# Patient Record
Sex: Female | Born: 1950 | Hispanic: No | Marital: Married | Smoking: Never smoker
Health system: Southern US, Community
[De-identification: ages and names within clinical notes are randomized; demographics above are authoritative.]

## PROBLEM LIST (undated history)

## (undated) DIAGNOSIS — E785 Hyperlipidemia, unspecified: Secondary | ICD-10-CM

## (undated) DIAGNOSIS — I10 Essential (primary) hypertension: Secondary | ICD-10-CM

## (undated) DIAGNOSIS — G7000 Myasthenia gravis without (acute) exacerbation: Secondary | ICD-10-CM

## (undated) DIAGNOSIS — E119 Type 2 diabetes mellitus without complications: Secondary | ICD-10-CM

## (undated) DIAGNOSIS — K219 Gastro-esophageal reflux disease without esophagitis: Secondary | ICD-10-CM

## (undated) HISTORY — DX: Type 2 diabetes mellitus without complications: E11.9

## (undated) HISTORY — DX: Gastro-esophageal reflux disease without esophagitis: K21.9

## (undated) HISTORY — DX: Myasthenia gravis without (acute) exacerbation: G70.00

## (undated) HISTORY — DX: Essential (primary) hypertension: I10

## (undated) HISTORY — DX: Hyperlipidemia, unspecified: E78.5

## (undated) HISTORY — PX: ABDOMINAL HYSTERECTOMY: SHX81

---

## 1998-07-14 ENCOUNTER — Other Ambulatory Visit: Admission: RE | Admit: 1998-07-14 | Discharge: 1998-07-14 | Payer: Self-pay | Admitting: Obstetrics and Gynecology

## 2000-09-29 ENCOUNTER — Other Ambulatory Visit: Admission: RE | Admit: 2000-09-29 | Discharge: 2000-09-29 | Payer: Self-pay | Admitting: Obstetrics and Gynecology

## 2001-06-11 ENCOUNTER — Encounter: Admission: RE | Admit: 2001-06-11 | Discharge: 2001-06-11 | Payer: Self-pay | Admitting: Emergency Medicine

## 2001-06-11 ENCOUNTER — Encounter: Payer: Self-pay | Admitting: Emergency Medicine

## 2003-11-28 ENCOUNTER — Encounter: Admission: RE | Admit: 2003-11-28 | Discharge: 2003-11-28 | Payer: Self-pay | Admitting: Emergency Medicine

## 2004-03-23 ENCOUNTER — Encounter: Admission: RE | Admit: 2004-03-23 | Discharge: 2004-03-23 | Payer: Self-pay | Admitting: Emergency Medicine

## 2005-04-13 ENCOUNTER — Encounter: Admission: RE | Admit: 2005-04-13 | Discharge: 2005-06-08 | Payer: Self-pay | Admitting: Emergency Medicine

## 2006-03-29 ENCOUNTER — Encounter: Admission: RE | Admit: 2006-03-29 | Discharge: 2006-03-29 | Payer: Self-pay | Admitting: Emergency Medicine

## 2006-04-19 ENCOUNTER — Ambulatory Visit (HOSPITAL_COMMUNITY): Admission: RE | Admit: 2006-04-19 | Discharge: 2006-04-19 | Payer: Self-pay | Admitting: Gastroenterology

## 2008-01-28 ENCOUNTER — Encounter: Admission: RE | Admit: 2008-01-28 | Discharge: 2008-01-28 | Payer: Self-pay | Admitting: Internal Medicine

## 2009-07-31 ENCOUNTER — Encounter: Admission: RE | Admit: 2009-07-31 | Discharge: 2009-07-31 | Payer: Self-pay | Admitting: Internal Medicine

## 2010-03-13 ENCOUNTER — Encounter: Payer: Self-pay | Admitting: Emergency Medicine

## 2010-07-09 NOTE — Op Note (Signed)
NAMEBRIGHTEN, BUZZELLI               ACCOUNT NO.:  [id number]   MEDICAL RECORD NO.:  [id number]          PATIENT TYPE:  AMB   LOCATION:  ENDO                         FACILITY:  [facility]   PHYSICIAN:  Anselmo Rod, M.D.  DATE OF BIRTH:  [birth year]   DATE OF PROCEDURE:  04/19/2006  DATE OF DISCHARGE:                               OPERATIVE REPORT   PROCEDURE PERFORMED:  Screening colonoscopy.   ENDOSCOPIST:  Anselmo Rod, MD.   INSTRUMENT USED:  Pentax video colonoscope.   INDICATIONS FOR PROCEDURE:  A 61 year old Asian female undergoing  screening colonoscopy to rule out colonic polyps, masses, etc.   PREPROCEDURE PREPARATION:  Informed consent was procured from the  patient.  The patient fasted for 4 hours prior to the procedure and  prepped with 20 OsmoPrep pills the night of and 12 pills the morning of  the procedure.  Risks and benefits of the procedure including a 10% miss  rate of cancer and polyp were discussed with the patient as well.   PREPARATION:  VITAL SIGNS:  The patient had stable vital signs.  NECK:  Supple.  CHEST:  Clear to auscultation.  S1, S2 regular.  ABDOMEN:  Soft with normal bowel sounds.   DESCRIPTION OF PROCEDURE:  The patient was placed in the left lateral  decubitus position and sedated with 100 mcg of fentanyl and 10 mg of  Versed given intravenously in slow incremental doses.  Once the patient  was adequately sedate and maintained on low-flow oxygen and continuous  cardiac monitoring, the Pentax video colonoscope was advanced from the  rectum to the cecum.  The appendiceal orifice and ileocecal valve were  visualized and photographed.  There was some residual stool in the  colon.  Multiple washes were done.  No masses, polyps, erosions,  ulcerations or diverticula were seen.  Retroflexion revealed no  biopsies.  The terminal ileum appeared healthy and without lesions.   IMPRESSION:  Normal colonoscopy up to the terminal ileum.  No masses,  polyps or diverticula seen.   RECOMMENDATIONS:  1. Continue a high fiber diet with liberal fluid intake.  2. Repeat colonoscopy in the next 10 years.  If the patient has any      abnormal symptoms in the interim, she has been advised to contact      the office immediately for further recommendations.  3. Outpatient follow-up as the need arises in the future.      Anselmo Rod, M.D.  Electronically Signed     JNM/MEDQ  D:  04/19/2006  T:  04/19/2006  Job:  [id number]   cc:   Reuben Likes, M.D.

## 2013-10-13 ENCOUNTER — Emergency Department (HOSPITAL_COMMUNITY): Payer: 59

## 2013-10-13 ENCOUNTER — Encounter (HOSPITAL_COMMUNITY): Payer: Self-pay | Admitting: Emergency Medicine

## 2013-10-13 ENCOUNTER — Inpatient Hospital Stay (HOSPITAL_COMMUNITY)
Admission: EM | Admit: 2013-10-13 | Discharge: 2013-10-14 | DRG: 069 | Disposition: A | Discharge status: Home / Self Care | Payer: 59 | Attending: Internal Medicine | Admitting: Internal Medicine

## 2013-10-13 ENCOUNTER — Observation Stay (HOSPITAL_COMMUNITY): Payer: 59

## 2013-10-13 ENCOUNTER — Ambulatory Visit (INDEPENDENT_AMBULATORY_CARE_PROVIDER_SITE_OTHER): Payer: 59 | Admitting: Emergency Medicine

## 2013-10-13 VITALS — BP 162/82 | HR 69 | Temp 98.5°F | Resp 17 | Ht 61.5 in | Wt 144.0 lb

## 2013-10-13 DIAGNOSIS — R42 Dizziness and giddiness: Secondary | ICD-10-CM

## 2013-10-13 DIAGNOSIS — G459 Transient cerebral ischemic attack, unspecified: Principal | ICD-10-CM | POA: Diagnosis present

## 2013-10-13 DIAGNOSIS — E785 Hyperlipidemia, unspecified: Secondary | ICD-10-CM | POA: Diagnosis present

## 2013-10-13 DIAGNOSIS — R4789 Other speech disturbances: Secondary | ICD-10-CM

## 2013-10-13 DIAGNOSIS — R29810 Facial weakness: Secondary | ICD-10-CM | POA: Diagnosis present

## 2013-10-13 DIAGNOSIS — E119 Type 2 diabetes mellitus without complications: Secondary | ICD-10-CM | POA: Diagnosis present

## 2013-10-13 DIAGNOSIS — Z79899 Other long term (current) drug therapy: Secondary | ICD-10-CM

## 2013-10-13 DIAGNOSIS — R4702 Dysphasia: Secondary | ICD-10-CM | POA: Diagnosis present

## 2013-10-13 DIAGNOSIS — I10 Essential (primary) hypertension: Secondary | ICD-10-CM | POA: Diagnosis present

## 2013-10-13 DIAGNOSIS — G458 Other transient cerebral ischemic attacks and related syndromes: Secondary | ICD-10-CM

## 2013-10-13 DIAGNOSIS — R4781 Slurred speech: Secondary | ICD-10-CM

## 2013-10-13 LAB — SEDIMENTATION RATE: Sed Rate: 33 mm/hr — ABNORMAL HIGH (ref 0–22)

## 2013-10-13 LAB — URINALYSIS, ROUTINE W REFLEX MICROSCOPIC
Bilirubin Urine: NEGATIVE
Glucose, UA: NEGATIVE mg/dL
HGB URINE DIPSTICK: NEGATIVE
KETONES UR: NEGATIVE mg/dL
Leukocytes, UA: NEGATIVE
Nitrite: NEGATIVE
Protein, ur: NEGATIVE mg/dL
Specific Gravity, Urine: 1.008 (ref 1.005–1.030)
Urobilinogen, UA: 0.2 mg/dL (ref 0.0–1.0)
pH: 7 (ref 5.0–8.0)

## 2013-10-13 LAB — CBC
HEMATOCRIT: 38 % (ref 36.0–46.0)
HEMOGLOBIN: 13.4 g/dL (ref 12.0–15.0)
MCH: 30.1 pg (ref 26.0–34.0)
MCHC: 35.3 g/dL (ref 30.0–36.0)
MCV: 85.4 fL (ref 78.0–100.0)
Platelets: 275 10*3/uL (ref 150–400)
RBC: 4.45 MIL/uL (ref 3.87–5.11)
RDW: 12.3 % (ref 11.5–15.5)
WBC: 7.1 10*3/uL (ref 4.0–10.5)

## 2013-10-13 LAB — GLUCOSE, POCT (MANUAL RESULT ENTRY): POC GLUCOSE: 167 mg/dL — AB (ref 70–99)

## 2013-10-13 LAB — DIFFERENTIAL
BASOS PCT: 0 % (ref 0–1)
Basophils Absolute: 0 10*3/uL (ref 0.0–0.1)
EOS ABS: 0.3 10*3/uL (ref 0.0–0.7)
Eosinophils Relative: 4 % (ref 0–5)
LYMPHS ABS: 2.5 10*3/uL (ref 0.7–4.0)
Lymphocytes Relative: 36 % (ref 12–46)
Monocytes Absolute: 0.4 10*3/uL (ref 0.1–1.0)
Monocytes Relative: 6 % (ref 3–12)
NEUTROS ABS: 3.8 10*3/uL (ref 1.7–7.7)
Neutrophils Relative %: 54 % (ref 43–77)

## 2013-10-13 LAB — I-STAT TROPONIN, ED: TROPONIN I, POC: 0 ng/mL (ref 0.00–0.08)

## 2013-10-13 LAB — RAPID URINE DRUG SCREEN, HOSP PERFORMED
Amphetamines: NOT DETECTED
Barbiturates: NOT DETECTED
Benzodiazepines: NOT DETECTED
Cocaine: NOT DETECTED
OPIATES: NOT DETECTED
Tetrahydrocannabinol: NOT DETECTED

## 2013-10-13 LAB — I-STAT CHEM 8, ED
BUN: 13 mg/dL (ref 6–23)
Calcium, Ion: 1.18 mmol/L (ref 1.13–1.30)
Chloride: 105 mEq/L (ref 96–112)
Creatinine, Ser: 0.8 mg/dL (ref 0.50–1.10)
GLUCOSE: 168 mg/dL — AB (ref 70–99)
HCT: 40 % (ref 36.0–46.0)
HEMOGLOBIN: 13.6 g/dL (ref 12.0–15.0)
POTASSIUM: 4.5 meq/L (ref 3.7–5.3)
Sodium: 135 mEq/L — ABNORMAL LOW (ref 137–147)
TCO2: 29 mmol/L (ref 0–100)

## 2013-10-13 LAB — COMPREHENSIVE METABOLIC PANEL
ALT: 26 U/L (ref 0–35)
ANION GAP: 11 (ref 5–15)
AST: 22 U/L (ref 0–37)
Albumin: 3.9 g/dL (ref 3.5–5.2)
Alkaline Phosphatase: 91 U/L (ref 39–117)
BILIRUBIN TOTAL: 0.3 mg/dL (ref 0.3–1.2)
BUN: 13 mg/dL (ref 6–23)
CALCIUM: 9.7 mg/dL (ref 8.4–10.5)
CHLORIDE: 98 meq/L (ref 96–112)
CO2: 27 mEq/L (ref 19–32)
Creatinine, Ser: 0.74 mg/dL (ref 0.50–1.10)
GFR, EST NON AFRICAN AMERICAN: 89 mL/min — AB (ref >90)
GLUCOSE: 165 mg/dL — AB (ref 70–99)
POTASSIUM: 4.6 meq/L (ref 3.7–5.3)
Sodium: 136 mEq/L — ABNORMAL LOW (ref 137–147)
TOTAL PROTEIN: 8 g/dL (ref 6.0–8.3)

## 2013-10-13 LAB — CK: CK TOTAL: 102 U/L (ref 7–177)

## 2013-10-13 LAB — PROTIME-INR
INR: 0.99 (ref 0.00–1.49)
PROTHROMBIN TIME: 13.1 s (ref 11.6–15.2)

## 2013-10-13 LAB — ETHANOL: Alcohol, Ethyl (B): <11 mg/dL (ref 0–11)

## 2013-10-13 LAB — GLUCOSE, CAPILLARY: GLUCOSE-CAPILLARY: 263 mg/dL — AB (ref 70–99)

## 2013-10-13 LAB — APTT: APTT: 39 s — AB (ref 24–37)

## 2013-10-13 MED ORDER — AMLODIPINE BESYLATE 5 MG PO TABS
5.0000 mg | ORAL_TABLET | Freq: Every day | ORAL | Status: DC
  Administered 2013-10-13 – 2013-10-14 (×2): 5 mg via ORAL
  Filled 2013-10-13 (×2): qty 1

## 2013-10-13 MED ORDER — ASPIRIN 325 MG PO TABS
325.0000 mg | ORAL_TABLET | Freq: Every day | ORAL | Status: DC
  Administered 2013-10-13 – 2013-10-14 (×2): 325 mg via ORAL
  Filled 2013-10-13 (×2): qty 1

## 2013-10-13 MED ORDER — METOPROLOL SUCCINATE ER 50 MG PO TB24
50.0000 mg | ORAL_TABLET | Freq: Every day | ORAL | Status: DC
  Administered 2013-10-14: 50 mg via ORAL
  Filled 2013-10-13 (×2): qty 1

## 2013-10-13 MED ORDER — IRBESARTAN 150 MG PO TABS
150.0000 mg | ORAL_TABLET | Freq: Every day | ORAL | Status: DC
  Administered 2013-10-13 – 2013-10-14 (×2): 150 mg via ORAL
  Filled 2013-10-13 (×2): qty 1

## 2013-10-13 MED ORDER — ONDANSETRON HCL 4 MG PO TABS: 4.0000 mg | ORAL_TABLET | Freq: Four times a day (QID) | ORAL | Status: DC | PRN

## 2013-10-13 MED ORDER — ONDANSETRON HCL 4 MG/2ML IJ SOLN: 4.0000 mg | Freq: Four times a day (QID) | INTRAMUSCULAR | Status: DC | PRN

## 2013-10-13 MED ORDER — ATORVASTATIN CALCIUM 10 MG PO TABS
10.0000 mg | ORAL_TABLET | Freq: Every day | ORAL | Status: DC
  Administered 2013-10-13 – 2013-10-14 (×2): 10 mg via ORAL
  Filled 2013-10-13 (×2): qty 1

## 2013-10-13 MED ORDER — MECLIZINE HCL 25 MG PO TABS
25.0000 mg | ORAL_TABLET | Freq: Once | ORAL | Status: AC
  Administered 2013-10-13: 25 mg via ORAL
  Filled 2013-10-13: qty 1

## 2013-10-13 MED ORDER — HYDROCHLOROTHIAZIDE 12.5 MG PO CAPS
12.5000 mg | ORAL_CAPSULE | Freq: Every day | ORAL | Status: DC
  Administered 2013-10-13 – 2013-10-14 (×2): 12.5 mg via ORAL
  Filled 2013-10-13 (×2): qty 1

## 2013-10-13 MED ORDER — ACETAMINOPHEN 325 MG PO TABS
650.0000 mg | ORAL_TABLET | Freq: Four times a day (QID) | ORAL | Status: DC | PRN
Start: 2013-10-13 — End: 2013-10-14

## 2013-10-13 MED ORDER — INSULIN ASPART 100 UNIT/ML ~~LOC~~ SOLN
0.0000 [IU] | Freq: Three times a day (TID) | SUBCUTANEOUS | Status: DC
  Administered 2013-10-14: 5 [IU] via SUBCUTANEOUS
  Administered 2013-10-14 (×2): 2 [IU] via SUBCUTANEOUS

## 2013-10-13 MED ORDER — HEPARIN SODIUM (PORCINE) 5000 UNIT/ML IJ SOLN
5000.0000 [IU] | Freq: Three times a day (TID) | INTRAMUSCULAR | Status: DC
  Administered 2013-10-13 – 2013-10-14 (×3): 5000 [IU] via SUBCUTANEOUS
  Filled 2013-10-13 (×3): qty 1

## 2013-10-13 MED ORDER — PANTOPRAZOLE SODIUM 40 MG PO TBEC
40.0000 mg | DELAYED_RELEASE_TABLET | Freq: Every day | ORAL | Status: DC
  Administered 2013-10-13 – 2013-10-14 (×2): 40 mg via ORAL
  Filled 2013-10-13 (×2): qty 1

## 2013-10-13 MED ORDER — SODIUM CHLORIDE 0.9 % IV SOLN
INTRAVENOUS | Status: DC
  Administered 2013-10-13: 19:00:00 via INTRAVENOUS

## 2013-10-13 MED ORDER — INSULIN ASPART 100 UNIT/ML ~~LOC~~ SOLN
0.0000 [IU] | Freq: Every day | SUBCUTANEOUS | Status: DC
  Administered 2013-10-13: 3 [IU] via SUBCUTANEOUS

## 2013-10-13 MED ORDER — ACETAMINOPHEN 650 MG RE SUPP
650.0000 mg | Freq: Four times a day (QID) | RECTAL | Status: DC | PRN
Start: 2013-10-13 — End: 2013-10-14

## 2013-10-13 MED ORDER — OLMESARTAN-AMLODIPINE-HCTZ 20-5-12.5 MG PO TABS: 1.0000 | ORAL_TABLET | Freq: Every day | ORAL | Status: DC

## 2013-10-13 NOTE — Progress Notes (Addendum)
Subjective:    [name], female    DOB: [birth year], 63 y.o.   MRN: [id number] This chart was scribed for [place]. Everlene Farrier, MD by Steva Colder, ED Scribe. The patient was seen in room 6 at 1:24 PM.   Chief Complaint  Patient presents with  . Dizziness  . Aphasia    HPI Cassidy Carlson is a 63 y.o. female who presents today complaining of dizziness and aphasia onset yesterday afternoon around 2 PM. Daughter states that the pt when she tries to talk her tongue feels heavy and it is hard for the words to come out. She denies her tongue being swollen at the time. She states that when she opens her eyes she feels dizzy, but when her eyes are closed, she feels fine. Daughter states that the pt was getting better yesterday a little while after the symptoms began. Daughter states that today the pt is getting worse. She states that she is having associated symptoms of weakness and double vision. She states that with far vision or looking at something far away, she sees double. She denies HA, CP, and any other associated symptoms. Daughter states that the pt glucose this morning was 120.    There are no active problems to display for this patient.  No past medical history on file. No past surgical history on file. Allergies  Allergen Reactions  . Sulfa Antibiotics Rash   Prior to Admission medications   Medication Sig Start Date End Date Taking? Authorizing Provider  metoprolol succinate (TOPROL-XL) 50 MG 24 hr tablet Take 50 mg by mouth daily. Take with or immediately following a meal.   Yes Historical Provider, MD  Olmesartan-Amlodipine-HCTZ (TRIBENZOR) 20-5-12.5 MG TABS Take by mouth.   Yes Historical Provider, MD  rosuvastatin (CRESTOR) 10 MG tablet Take 10 mg by mouth daily.   Yes Historical Provider, MD  sitaGLIPtin (JANUVIA) 100 MG tablet Take 100 mg by mouth daily.   Yes Historical Provider, MD  escitalopram (LEXAPRO) 5 MG tablet Take 5 mg by mouth daily.    Historical  Provider, MD      Review of Systems  Eyes:       Seeing double  Cardiovascular: Negative for chest pain.  Neurological: Positive for dizziness and weakness. Negative for headaches.       Objective:   Physical Exam  Nursing note and vitals reviewed. Constitutional: She is oriented to person, place, and time. She appears well-developed and well-nourished. No distress.  HENT:  Head: Normocephalic and atraumatic.  Eyes: EOM are normal.  Neck: Neck supple. No tracheal deviation present.  Cardiovascular: Normal rate.   Pulmonary/Chest: Effort normal. No respiratory distress.  Musculoskeletal: Normal range of motion.  Neurological: She is alert and oriented to person, place, and time.  Reflex Scores:      Patellar reflexes are 3+ on the right side and 3+ on the left side. Thickening of speech. Difficulty pronouncing words. NAD. No carotid bruits. Motor strength 5/5 DTR 3 + in knees. Toes are down going with a Babinski response. Rhomberg's is not ataxic, but there may be some upper extremity dysmetria.   Skin: Skin is warm and dry.  Psychiatric: She has a normal mood and affect. Her behavior is normal.    No results found for this or any previous visit.  Glucose 167    BP 162/82  Pulse 69  Temp(Src) 98.5 F (36.9 C) (Oral)  Resp 17  Ht 5' 1.5" (1.562 m)  Wt 144 lb (  65.318 kg)  BMI 26.77 kg/m2  SpO2 98%  Assessment & Plan:  DIAGNOSTIC STUDIES: Oxygen Saturation is 98% on room air, normal by my interpretation.    COORDINATION OF CARE: 1:37 PM-Discussed treatment plan which includes transport to [facility] for further evaulation with pt family at bedside and pt family agreed to plan.  Patient with a history of diabetes. Patient has a history of difficulty with speech , she feels is low her tongue is thick and thin she has difficulty getting out specific words. She does not have much of a headache. She does state she has some vertigo and has had some double vision with this.  This certainly could be a problem in the posterior circulation. Her blood pressure is elevated.  I personally performed the services described in this documentation, which was scribed in my presence. The recorded information has been reviewed and is accurate.

## 2013-10-13 NOTE — Progress Notes (Signed)
Pt received to unit from ED via stretcher accompanied by husband and daughter. Pt speaks English as second language but communicates needs well. Pt states she is Muslim and prefers female Engineer, civil (consulting). Pt and family oriented to unit and safety needs.

## 2013-10-13 NOTE — ED Provider Notes (Signed)
CSN: [id number]     Arrival date & time 10/13/13  1420 History   First MD Initiated Contact with Patient 10/13/13 1506     Chief Complaint  Patient presents with  . Cerebrovascular Accident     (Consider location/radiation/quality/duration/timing/severity/associated sxs/prior Treatment) HPI Geralda Uphoff is a 63 y.o. female who presents to emergency department complaining of blurred vision and difficulty speaking. Patient states her symptoms began around 3 PM yesterday. She states symptoms began with double vision in both eyes, dizziness, sensation of room spinning, difficulty speaking. She states she feels like her to this heavy. She also feels like she can't puff out her cheeks specifically of the right side. She states she feels like her symptoms somewhat improved possibly, but returned this morning. States that gradually improving since but still not back to baseline. She states she still having a mild headache. She states her speech is improving. States vision is almost back to normal. She denies any difficulty understanding speech. No facial droop. No weakness or numbness in extremities. Patient does feel dizzy when she's walking.   History reviewed. No pertinent past medical history. History reviewed. No pertinent past surgical history. History reviewed. No pertinent family history. History  Substance Use Topics  . Smoking status: Never Smoker   . Smokeless tobacco: Not on file  . Alcohol Use: No   OB History   Grav Para Term Preterm Abortions TAB SAB Ect Mult Living                 Review of Systems  Constitutional: Negative for fever and chills.  Eyes: Positive for visual disturbance.  Respiratory: Negative for cough, chest tightness and shortness of breath.   Cardiovascular: Negative for chest pain, palpitations and leg swelling.  Gastrointestinal: Negative for nausea, vomiting, abdominal pain and diarrhea.  Genitourinary: Negative for dysuria, flank pain and pelvic pain.   Musculoskeletal: Negative for arthralgias, myalgias, neck pain and neck stiffness.  Skin: Negative for rash.  Neurological: Positive for dizziness, speech difficulty, light-headedness and headaches. Negative for syncope, facial asymmetry, weakness and numbness.  All other systems reviewed and are negative.     Allergies  Sulfa antibiotics  Home Medications   Prior to Admission medications   Medication Sig Start Date End Date Taking? Authorizing Provider  CALCIUM-VITAMIN D PO Take 1 tablet by mouth daily.   Yes Historical Provider, MD  esomeprazole (NEXIUM) 40 MG capsule Take 40 mg by mouth daily at 12 noon.   Yes Historical Provider, MD  metoprolol succinate (TOPROL-XL) 50 MG 24 hr tablet Take 50 mg by mouth daily. Take with or immediately following a meal.   Yes Historical Provider, MD  Multiple Vitamin (MULTIVITAMIN WITH MINERALS) TABS tablet Take 1 tablet by mouth daily.   Yes Historical Provider, MD  Olmesartan-Amlodipine-HCTZ (TRIBENZOR) 20-5-12.5 MG TABS Take 1 tablet by mouth daily.    Yes Historical Provider, MD  ranitidine (ZANTAC) 75 MG tablet Take 150 mg by mouth daily as needed for heartburn.   Yes Historical Provider, MD  rosuvastatin (CRESTOR) 10 MG tablet Take 10 mg by mouth daily.   Yes Historical Provider, MD  sitaGLIPtin (JANUVIA) 100 MG tablet Take 100 mg by mouth daily.   Yes Historical Provider, MD  THIAMINE HCL PO Take 1 tablet by mouth daily.   Yes Historical Provider, MD   BP 152/69  Pulse 64  Temp(Src) 98.1 F (36.7 C) (Oral)  Resp 20  Ht 5\' 2"  (1.575 m)  Wt 143 lb (64.864 kg)  BMI  26.15 kg/m2  SpO2 97% Physical Exam  Nursing note and vitals reviewed. Constitutional: She is oriented to person, place, and time. She appears well-developed and well-nourished. No distress.  HENT:  Head: Normocephalic.  Eyes: Conjunctivae and EOM are normal. Pupils are equal, round, and reactive to light.  Neck: Normal range of motion. Neck supple.  Cardiovascular:  Normal rate, regular rhythm and normal heart sounds.   Pulmonary/Chest: Effort normal and breath sounds normal. No respiratory distress. She has no wheezes. She has no rales.  Abdominal: Soft. Bowel sounds are normal. She exhibits no distension. There is no tenderness. There is no rebound.  Musculoskeletal: She exhibits no edema.  Neurological: She is alert and oriented to person, place, and time. She has normal strength. No sensory deficit. Coordination normal. GCS eye subscore is 4. GCS verbal subscore is 5. GCS motor subscore is 6.  Pt unable to puff up cheecks on right side, otherwise normal cranial nerves. No pronator drift. 5/5 and equal upper and lower extremity strength bilaterally. Visual fields intact  Skin: Skin is warm and dry.  Psychiatric: She has a normal mood and affect. Her behavior is normal.    ED Course  Procedures (including critical care time) Labs Review Labs Reviewed  APTT - Abnormal; Notable for the following:    aPTT 39 (*)    All other components within normal limits  COMPREHENSIVE METABOLIC PANEL - Abnormal; Notable for the following:    Sodium 136 (*)    Glucose, Bld 165 (*)    GFR calc non Af Amer 89 (*)    All other components within normal limits  I-STAT CHEM 8, ED - Abnormal; Notable for the following:    Sodium 135 (*)    Glucose, Bld 168 (*)    All other components within normal limits  ETHANOL  PROTIME-INR  CBC  DIFFERENTIAL  URINE RAPID DRUG SCREEN (HOSP PERFORMED)  URINALYSIS, ROUTINE W REFLEX MICROSCOPIC  I-STAT TROPOININ, ED    Imaging Review Ct Head Wo Contrast  10/13/2013   CLINICAL DATA:  Slurred speech  EXAM: CT HEAD WITHOUT CONTRAST  TECHNIQUE: Contiguous axial images were obtained from the base of the skull through the vertex without intravenous contrast.  COMPARISON:  None.  FINDINGS: Ventricles are normal in size and configuration. There is no mass, hemorrhage, extra-axial fluid collection, or midline shift. There is rather minimal  small vessel disease in the centra semiovale bilaterally. Elsewhere gray-white compartments are normal. No acute infarct is apparent. Bony calvarium appears intact. The mastoid air cells are clear.  IMPRESSION: Rather minimal periventricular small vessel disease. No demonstrable intracranial mass, hemorrhage, or acute appearing infarct.   Electronically Signed   By: Lowella Grip M.D.   On: 10/13/2013 15:37     EKG Interpretation   Date/Time:  Sunday October 13 2013 14:40:41 EDT Ventricular Rate:  63 PR Interval:  174 QRS Duration: 90 QT Interval:  411 QTC Calculation: 421 R Axis:   54 Text Interpretation:  Sinus rhythm RSR' in V1 or V2, probably normal  variant Similar to prior Confirmed by [facility]  MD, BLAIR ([id number]) on  10/14/2013 12:16:48 AM      MDM   Final diagnoses:  Dysphasia  Dizziness   Pt with difficulty with speech, "heavy tongue," dizziness. Concerning for CVA. Onset of symptoms 24 hrs ago. Will get labs, CT head. Monitor.    4:31 PM CT head negative. Labs unremarkable. Pt's symptoms improving. Discussed with [facility], will admit for further work up. MR brain  ordered.   Renold Genta, PA-C 10/14/13 [phone]

## 2013-10-13 NOTE — H&P (Signed)
[facility] History and Physical  Cassidy Carlson QBV:[id number] DOB: [birth year] DOA: 10/13/2013  Referring physician: Emergency Department PCP: No PCP Per Patient  Specialists:   Chief Complaint: Dizziness  HPI: Cassidy Carlson is a 63 y.o. female  With a hx of DM and HTN who presented to Urgent care with complaints of lightheadedness with slurred speech and some vision changes that started around 1400 on 8/22. Sx gradually subsided, however, pt presented for further work up and was subsequently referred to the ED for eval. In the ED, pt was found to have an unremarkable head CT. Given risk factors and presenting symptoms, the hospitalist service was consulted for CVA/TIA work up. Neurology has also been consulted through the ED.  Review of Systems: Per above, the remainder of the 10pt ros reviewed and are neg  History reviewed. No pertinent past medical history. History reviewed. No pertinent past surgical history. Social History:  reports that she has never smoked. She does not have any smokeless tobacco history on file. She reports that she does not drink alcohol. Her drug history is not on file.  where does patient live--home, ALF, SNF? and with whom if at home?  Can patient participate in ADLs?  Allergies  Allergen Reactions  . Sulfa Antibiotics Rash    History reviewed. No pertinent family history. reviewed and is noncontributory to this particular case (be sure to complete)  Prior to Admission medications   Medication Sig Start Date End Date Taking? Authorizing Provider  CALCIUM-VITAMIN D PO Take 1 tablet by mouth daily.   Yes Historical Provider, MD  esomeprazole (NEXIUM) 40 MG capsule Take 40 mg by mouth daily at 12 noon.   Yes Historical Provider, MD  metoprolol succinate (TOPROL-XL) 50 MG 24 hr tablet Take 50 mg by mouth daily. Take with or immediately following a meal.   Yes Historical Provider, MD  Multiple Vitamin (MULTIVITAMIN WITH MINERALS) TABS tablet Take 1  tablet by mouth daily.   Yes Historical Provider, MD  Olmesartan-Amlodipine-HCTZ (TRIBENZOR) 20-5-12.5 MG TABS Take 1 tablet by mouth daily.    Yes Historical Provider, MD  ranitidine (ZANTAC) 75 MG tablet Take 150 mg by mouth daily as needed for heartburn.   Yes Historical Provider, MD  rosuvastatin (CRESTOR) 10 MG tablet Take 10 mg by mouth daily.   Yes Historical Provider, MD  sitaGLIPtin (JANUVIA) 100 MG tablet Take 100 mg by mouth daily.   Yes Historical Provider, MD  THIAMINE HCL PO Take 1 tablet by mouth daily.   Yes Historical Provider, MD   Physical Exam: Filed Vitals:   10/13/13 1422 10/13/13 1433 10/13/13 1444 10/13/13 1630  BP: 164/62 152/69  145/60  Pulse: 68 64  64  Temp: 98.2 F (36.8 C) 98.1 F (36.7 C) 98.1 F (36.7 C)   TempSrc: Oral Oral    Resp: 21 20    Height: 5\' 2"  (1.575 m)     Weight: 64.864 kg (143 lb)     SpO2: 100% 97%  97%     General:  Awake, in nad  Eyes: PERRL B  ENT: membranes moist, dentition fair  Neck: trachea midline, neck supple  Cardiovascular: regular, s1, s2  Respiratory: normal resp effort, no wheezing  Abdomen: soft, nondistended  Skin: normal skin turgor, no abnormal skin lesions seen  Musculoskeletal: perfused, no clubbing  Psychiatric: mood/affect normal// no auditory/visual hallucinations  Neurologic: cn2-12 grossly intact/strength/sensation intact  Labs on Admission:  Basic Metabolic Panel:  Recent Labs Lab 10/13/13 1455 10/13/13 1514  NA 136*  135*  K 4.6 4.5  CL 98 105  CO2 27  --   GLUCOSE 165* 168*  BUN 13 13  CREATININE 0.74 0.80  CALCIUM 9.7  --    Liver Function Tests:  Recent Labs Lab 10/13/13 1455  AST 22  ALT 26  ALKPHOS 91  BILITOT 0.3  PROT 8.0  ALBUMIN 3.9   No results found for this basename: LIPASE, AMYLASE,  in the last 168 hours No results found for this basename: AMMONIA,  in the last 168 hours CBC:  Recent Labs Lab 10/13/13 1455 10/13/13 1514  WBC 7.1  --   NEUTROABS  3.8  --   HGB 13.4 13.6  HCT 38.0 40.0  MCV 85.4  --   PLT 275  --    Cardiac Enzymes: No results found for this basename: CKTOTAL, CKMB, CKMBINDEX, TROPONINI,  in the last 168 hours  BNP (last 3 results) No results found for this basename: PROBNP,  in the last 8760 hours CBG: No results found for this basename: GLUCAP,  in the last 168 hours  Radiological Exams on Admission: Ct Head Wo Contrast  10/13/2013   CLINICAL DATA:  Slurred speech  EXAM: CT HEAD WITHOUT CONTRAST  TECHNIQUE: Contiguous axial images were obtained from the base of the skull through the vertex without intravenous contrast.  COMPARISON:  None.  FINDINGS: Ventricles are normal in size and configuration. There is no mass, hemorrhage, extra-axial fluid collection, or midline shift. There is rather minimal small vessel disease in the centra semiovale bilaterally. Elsewhere gray-white compartments are normal. No acute infarct is apparent. Bony calvarium appears intact. The mastoid air cells are clear.  IMPRESSION: Rather minimal periventricular small vessel disease. No demonstrable intracranial mass, hemorrhage, or acute appearing infarct.   Electronically Signed   By: Lowella Grip M.D.   On: 10/13/2013 15:37    EKG: Independently reviewed. NSR  Assessment/Plan Principal Problem:   TIA (transient ischemic attack) Active Problems:   Diabetes   Unspecified essential hypertension   Dysphasia  1. TIA/CVA 1. Neurology consulted through the ED 2. Will continue on asa 3. Lipid panel and A1c ordered 4. Will f/u MRI brain 5. Check 2d echo and carotid dopplers 6. Admit to med-tele 7. PT/OT/SLP 2. DM 1. Check a1c 2. Cont on SSI alone and hold oral hyperglycemic while inpatient 3. Cont on carb modified diet 3. HTN 1. BP currently controlled 2. Cont home meds 4. DVT prophylaxis 1. Heparin subQ  Code Status: Full (must indicate code status--if unknown or must be presumed, indicate so) Family Communication: Pt in  room, family at bedside (indicate person spoken with, if applicable, with phone number if by telephone) Disposition Plan: Pending PT eval  Time spent: 32min  [facility], [facility] Pager [phone]  If 7PM-7AM, please contact night-coverage [email or web address] Password [email or web address] 10/13/2013, 5:02 PM

## 2013-10-13 NOTE — ED Notes (Signed)
Patient transported to MRI 

## 2013-10-13 NOTE — Consult Note (Signed)
Referring Physician: CHIU, S    Chief Complaint: New-onset dysarthria.  HPI: Cassidy Carlson is an 63 y.o. female history of hypertension and hyperlipidemia presenting with new-onset dysarthria which started about 2 PM on 10/12/2013. Dysarthria has slightly worsened since onset. There is no previous history stroke nor TIA. She has not been on antiplatelet therapy. She indicates she has been experiencing double vision occasionally, as well as slight drooping of her eyelids. She's also had slight difficulty with swallowing but no choking. She's had no focal motor weakness and no sensory changes on chronic carpal tunnel syndrome involving her right hand. CT scan of her head showed no acute intracranial abnormality. MRI results are pending. NIH stroke score was 1 for dysarthria.  LSN: 2 PM on 10/12/2013 tPA Given: No: Mild deficits; beyond time window for treatment consideration MRankin: 1  History reviewed. No pertinent past medical history.  History reviewed. No pertinent family history.   Medications: I have reviewed the patient's current medications.  ROS: History obtained from the patient  General ROS: negative for - chills, fatigue, fever, night sweats, weight gain or weight loss Psychological ROS: negative for - behavioral disorder, hallucinations, memory difficulties, mood swings or suicidal ideation Ophthalmic ROS: negative for - blurry vision, double vision, eye pain or loss of vision ENT ROS: negative for - epistaxis, nasal discharge, oral lesions, sore throat, tinnitus or vertigo Allergy and Immunology ROS: negative for - hives or itchy/watery eyes Hematological and Lymphatic ROS: negative for - bleeding problems, bruising or swollen lymph nodes Endocrine ROS: negative for - galactorrhea, hair pattern changes, polydipsia/polyuria or temperature intolerance Respiratory ROS: negative for - cough, hemoptysis, shortness of breath or wheezing Cardiovascular ROS: negative for - chest  pain, dyspnea on exertion, edema or irregular heartbeat Gastrointestinal ROS: negative for - abdominal pain, diarrhea, hematemesis, nausea/vomiting or stool incontinence Genito-Urinary ROS: negative for - dysuria, hematuria, incontinence or urinary frequency/urgency Musculoskeletal ROS: negative for - joint swelling or muscular weakness Neurological ROS: as noted in HPI Dermatological ROS: negative for rash and skin lesion changes  Physical Examination: Blood pressure 145/60, pulse 64, temperature 98.1 F (36.7 C), temperature source Oral, resp. rate 20, height 5\' 2"  (1.575 m), weight 64.864 kg (143 lb), SpO2 97.00%.  Neurologic Examination: Mental Status: Alert, oriented, thought content appropriate.  Speech minimally slurred without evidence of aphasia. Able to follow commands without difficulty. Cranial Nerves: II-Visual fields were normal. III/IV/VI-Pupils were equal and reacted. Extraocular movements were full and conjugate. No ptosis of eyelids noted.   V/VII-no facial numbness; moderately severe bilateral symmetrical facial weakness, including orbicularis oculi and oris. VIII-normal. X-n mild dysarthria; symmetrical palatal movement. XII-midline tongue extension Motor: 5/5 bilaterally with normal tone and bulk Sensory: Normal throughout. Deep Tendon Reflexes: 2+ and symmetric. Plantars: Mute bilaterally Cerebellar: Normal finger-to-nose testing.  Ct Head Wo Contrast  10/13/2013   CLINICAL DATA:  Slurred speech  EXAM: CT HEAD WITHOUT CONTRAST  TECHNIQUE: Contiguous axial images were obtained from the base of the skull through the vertex without intravenous contrast.  COMPARISON:  None.  FINDINGS: Ventricles are normal in size and configuration. There is no mass, hemorrhage, extra-axial fluid collection, or midline shift. There is rather minimal small vessel disease in the centra semiovale bilaterally. Elsewhere gray-white compartments are normal. No acute infarct is apparent. Bony  calvarium appears intact. The mastoid air cells are clear.  IMPRESSION: Rather minimal periventricular small vessel disease. No demonstrable intracranial mass, hemorrhage, or acute appearing infarct.   Electronically Signed   By: Lowella Grip  M.D.   On: 10/13/2013 15:37    Assessment: 63 y.o. female with a history of diabetes mellitus and hypertension presenting with dysarthria, with additional findings including upper and lower facial weakness. Stroke is unlikely but cannot be completely ruled out at this point. I'm concerned that she may have a myopathic process causing dysarthria in addition to facial weakness, such as myasthenia gravis, particularly with her history of intermittent diplopia and lid ptosis.  Stroke Risk Factors - diabetes mellitus, hyperlipidemia and hypertension  Plan: 1. Stroke workup with risk assessment if MRI shows findings consistent with acute stroke. 2. Prophylactic therapy-Antiplatelet med: Aspirin  3. Myasthenia gravis antibody study 4. sedimentation rate and serum CK   C.R. Nicole Kindred, MD [facility]  10/13/2013, 6:24 PM

## 2013-10-13 NOTE — ED Notes (Signed)
Transporting Patient to new room assignment.

## 2013-10-13 NOTE — ED Notes (Signed)
Pt here for slurred speech since yesterday at 3pm. Pt sts her tongue feels heavy and has difficulty getting thoughts together. No other symptoms noted by ems,

## 2013-10-14 DIAGNOSIS — G459 Transient cerebral ischemic attack, unspecified: Principal | ICD-10-CM

## 2013-10-14 DIAGNOSIS — I6789 Other cerebrovascular disease: Secondary | ICD-10-CM

## 2013-10-14 DIAGNOSIS — R4789 Other speech disturbances: Secondary | ICD-10-CM

## 2013-10-14 LAB — COMPREHENSIVE METABOLIC PANEL
ALT: 22 U/L (ref 0–35)
AST: 20 U/L (ref 0–37)
Albumin: 3.5 g/dL (ref 3.5–5.2)
Alkaline Phosphatase: 72 U/L (ref 39–117)
Anion gap: 10 (ref 5–15)
BUN: 14 mg/dL (ref 6–23)
CHLORIDE: 100 meq/L (ref 96–112)
CO2: 28 mEq/L (ref 19–32)
CREATININE: 0.82 mg/dL (ref 0.50–1.10)
Calcium: 9.1 mg/dL (ref 8.4–10.5)
GFR calc Af Amer: 86 mL/min — ABNORMAL LOW (ref >90)
GFR calc non Af Amer: 75 mL/min — ABNORMAL LOW (ref >90)
Glucose, Bld: 126 mg/dL — ABNORMAL HIGH (ref 70–99)
Potassium: 4.2 mEq/L (ref 3.7–5.3)
Sodium: 138 mEq/L (ref 137–147)
Total Bilirubin: 0.4 mg/dL (ref 0.3–1.2)
Total Protein: 7.1 g/dL (ref 6.0–8.3)

## 2013-10-14 LAB — LIPID PANEL
CHOL/HDL RATIO: 5 ratio
Cholesterol: 165 mg/dL (ref 0–200)
HDL: 33 mg/dL — AB (ref >39)
LDL Cholesterol: 97 mg/dL (ref 0–99)
TRIGLYCERIDES: 175 mg/dL — AB (ref <150)
VLDL: 35 mg/dL (ref 0–40)

## 2013-10-14 LAB — HEMOGLOBIN A1C
Hgb A1c MFr Bld: 8.5 % — ABNORMAL HIGH (ref <5.7)
Mean Plasma Glucose: 197 mg/dL — ABNORMAL HIGH (ref <117)

## 2013-10-14 LAB — GLUCOSE, CAPILLARY
Glucose-Capillary: 126 mg/dL — ABNORMAL HIGH (ref 70–99)
Glucose-Capillary: 150 mg/dL — ABNORMAL HIGH (ref 70–99)
Glucose-Capillary: 215 mg/dL — ABNORMAL HIGH (ref 70–99)

## 2013-10-14 LAB — CBC
HCT: 35.8 % — ABNORMAL LOW (ref 36.0–46.0)
Hemoglobin: 12.6 g/dL (ref 12.0–15.0)
MCH: 30.8 pg (ref 26.0–34.0)
MCHC: 35.2 g/dL (ref 30.0–36.0)
MCV: 87.5 fL (ref 78.0–100.0)
PLATELETS: 264 10*3/uL (ref 150–400)
RBC: 4.09 MIL/uL (ref 3.87–5.11)
RDW: 12.6 % (ref 11.5–15.5)
WBC: 7.1 10*3/uL (ref 4.0–10.5)

## 2013-10-14 MED ORDER — ASPIRIN 81 MG PO TABS: 81.0000 mg | ORAL_TABLET | Freq: Every day | ORAL | Status: DC

## 2013-10-14 NOTE — Evaluation (Signed)
Occupational Therapy Evaluation Patient Details Name: Cassidy Carlson MRN: [id number] DOB: Sep 29, 1950 Today's Date: 10/14/2013    History of Present Illness 63 yo female admitted with diplopia, eye lip droop and dysarthria. 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 All education has been completed and the patient has no further questions. See below for any follow-up Occupational Therapy or equipment needs. OT to sign off. Thank you for referral. OT recommend follow up with outpatient.     Follow Up Recommendations  Outpatient OT;Other (comment) (vision)    Equipment Recommendations  None recommended by OT    Recommendations for Other Services Other (comment) (Outpatient vision follow up)     Precautions / Restrictions Precautions Precautions: Fall      Mobility Bed Mobility Overal bed mobility: Modified Independent                Transfers Overall transfer level: Needs assistance   Transfers: Sit to/from Stand Sit to Stand: Supervision              Balance Overall balance assessment: Needs assistance         Standing balance support: No upper extremity supported;During functional activity Standing balance-Leahy Scale: Fair               High level balance activites: Direction changes;Backward walking;Head turns High Level Balance Comments: LOB x2 with self correction            ADL Overall ADL's : Needs assistance/impaired     Grooming: Supervision/safety;Standing   Upper Body Bathing: Supervision/ safety;Standing               Toilet Transfer: Supervision/safety;Ambulation           Functional mobility during ADLs: Supervision/safety General ADL Comments: Pt with x2 LOB with hall ambulation with OT and self correction. pt drifting to the Rt with ambulation. pt reports diplopia with distance  scanning. pt with occlusion of vision with taping of glasses. Pt reports single vision in all areas/ quadrants at this time. Pt provided handout of visual exercises and return demo with min family (A). pt and family understand that visual changes could last for extended time period and follow up with MD reporting symptoms is important.      Vision Eye Alignment: Within Functional Limits Alignment/Gaze Preference: Head turned;Head tilt Ocular Range of Motion: Restricted on the right Tracking/Visual Pursuits: Left eye does not track laterally;Decreased smoothness of horizontal tracking;Unable to hold eye position out of midline;Requires cues, head turns, or add eye shifts to track   Convergence: Impaired (comment) Diplopia Assessment: Objects split on top of one another;Present in far gaze;Only with right gaze   Additional Comments: Pt provided taping occlusion of glasses with single image present   Perception     Praxis      Pertinent Vitals/Pain Pain Assessment: No/denies pain     Hand Dominance Right   Extremity/Trunk Assessment Upper Extremity Assessment Upper Extremity Assessment: Overall WFL for tasks assessed   Lower Extremity Assessment Lower Extremity Assessment: Defer to PT evaluation   Cervical / Trunk Assessment Cervical / Trunk Assessment: Normal   Communication Communication Communication: No difficulties;Prefers language other than English (speaks english but patients family translating during sessio)   Cognition Arousal/Alertness: Awake/alert Behavior During Therapy: WFL for tasks assessed/performed Overall Cognitive Status: Impaired/Different from baseline Area of Impairment: Awareness  Awareness: Emergent   General Comments: Pt with decr awareness to balance deficits and visual changes. Pt states "sometimes has double" pt with immediate diplopia present in Rt visual testing    General Comments       Exercises Exercises: Other  exercises Other Exercises Other Exercises: vision exercises provided   Shoulder Instructions      Home Living Family/patient expects to be discharged to:: Private residence Living Arrangements: Spouse/significant other                 Bathroom Shower/Tub: Teacher, early years/pre: Standard                Prior Functioning/Environment Level of Independence: Independent             OT Diagnosis:     OT Problem List:     OT Treatment/Interventions:      OT Goals(Current goals can be found in the care plan section)    OT Frequency:     Barriers to D/C:            Co-evaluation              End of Session Equipment Utilized During Treatment: Gait belt Nurse Communication: Mobility status;Precautions  Activity Tolerance: Patient tolerated treatment well Patient left: in bed;with call bell/phone within reach;with family/visitor present   Time: 1415-1447 OT Time Calculation (min): 32 min Charges:  OT General Charges $OT Visit: 1 Procedure OT Evaluation $Initial OT Evaluation Tier I: 1 Procedure OT Treatments $Self Care/Home Management : 8-22 mins $Therapeutic Activity: 8-22 mins G-Codes:    Parke Poisson B 11/02/2013, 4:00 PM Pager: [phone]

## 2013-10-14 NOTE — Progress Notes (Signed)
Patient d/c this evening. Needed an outpatient OT to be set up but it was after hours so MD (Dr. Pecolia Ades) put a consult for Care Management to call patient and  set that up tomorrow. Assessments remained unchanged prior to D/c.

## 2013-10-14 NOTE — Progress Notes (Signed)
VASCULAR LAB PRELIMINARY  PRELIMINARY  PRELIMINARY  PRELIMINARY  Carotid Dopplers completed.    Preliminary report:  1-39% ICA stenosis.  Vertebral artery flow is antegrade.   Pilar Westergaard, RVT 10/14/2013, 9:06 AM

## 2013-10-14 NOTE — Progress Notes (Signed)
[facility] PROGRESS NOTE  Cassidy Carlson ZOX:[id number] DOB: Apr 24, 1950 DOA: 10/13/2013 PCP: No PCP Per Patient Interim summary: Cassidy Carlson is a 63 y.o. female , hx of DM and HTN who presented to Urgent care with complaints of lightheadedness with slurred speech and some vision changes that started around 1400 on 8/22. In the ED, pt was found to have an unremarkable head CT. Given risk factors and presenting symptoms, the hospitalist service was consulted for CVA/TIA work up. Neurology has also been consulted through the ED.   Assessment/Plan: 1. TIA ? Myasthenia gravis: - admitted to telemetry and MRI done, negative for acute stroke. Prophylactic aspirin on board. Myasthenia gravis antibody pending. NIF.   2. Hypertension: controlled.   3. DVT prophylaxis.   Code Status: full code.  Family Communication: family at bedside.  Disposition Plan: pending further investigation.    Consultants:  Neurology.   Procedures:  MRI brain.   Antibiotics:  none  HPI/Subjective: Slow to respond, but denies any new complaints.   Objective: Filed Vitals:   10/14/13 1412  BP: 135/59  Pulse: 61  Temp: 98.7 F (37.1 C)  Resp: 18   No intake or output data in the 24 hours ending 10/14/13 1426 Filed Weights   10/13/13 1422  Weight: 64.864 kg (143 lb)    Exam:   General:  Alert afebrile comfortable  Cardiovascular: s1s2, normal  Regular rate and rhythm,   Respiratory: chest clear to auscultation, no wheezing or rhonchi  Abdomen: soft NT nd bs+  Musculoskeletal: no pedal edema.   Data Reviewed: Basic Metabolic Panel:  Recent Labs Lab 10/13/13 1455 10/13/13 1514 10/14/13 0358  NA 136* 135* 138  K 4.6 4.5 4.2  CL 98 105 100  CO2 27  --  28  GLUCOSE 165* 168* 126*  BUN 13 13 14   CREATININE 0.74 0.80 0.82  CALCIUM 9.7  --  9.1   Liver Function Tests:  Recent Labs Lab 10/13/13 1455 10/14/13 0358  AST 22 20  ALT 26 22  ALKPHOS 91 72  BILITOT 0.3 0.4   PROT 8.0 7.1  ALBUMIN 3.9 3.5   No results found for this basename: LIPASE, AMYLASE,  in the last 168 hours No results found for this basename: AMMONIA,  in the last 168 hours CBC:  Recent Labs Lab 10/13/13 1455 10/13/13 1514 10/14/13 0358  WBC 7.1  --  7.1  NEUTROABS 3.8  --   --   HGB 13.4 13.6 12.6  HCT 38.0 40.0 35.8*  MCV 85.4  --  87.5  PLT 275  --  264   Cardiac Enzymes:  Recent Labs Lab 10/13/13 1927  CKTOTAL 102   BNP (last 3 results) No results found for this basename: PROBNP,  in the last 8760 hours CBG:  Recent Labs Lab 10/13/13 2226 10/14/13 0647 10/14/13 1157  GLUCAP 263* 126* 150*    No results found for this or any previous visit (from the past 240 hour(s)).   Studies: Ct Head Wo Contrast  10/13/2013   CLINICAL DATA:  Slurred speech  EXAM: CT HEAD WITHOUT CONTRAST  TECHNIQUE: Contiguous axial images were obtained from the base of the skull through the vertex without intravenous contrast.  COMPARISON:  None.  FINDINGS: Ventricles are normal in size and configuration. There is no mass, hemorrhage, extra-axial fluid collection, or midline shift. There is rather minimal small vessel disease in the centra semiovale bilaterally. Elsewhere gray-white compartments are normal. No acute infarct is apparent. Bony calvarium appears intact. The  mastoid air cells are clear.  IMPRESSION: Rather minimal periventricular small vessel disease. No demonstrable intracranial mass, hemorrhage, or acute appearing infarct.   Electronically Signed   By: Lowella Grip M.D.   On: 10/13/2013 15:37   Mr Brain Wo Contrast  10/13/2013   CLINICAL DATA:  Stroke.  Slurred speech and vision change.  EXAM: MRI HEAD WITHOUT CONTRAST  TECHNIQUE: Multiplanar, multiecho pulse sequences of the brain and surrounding structures were obtained without intravenous contrast.  COMPARISON:  CT head 10/13/2013  FINDINGS: Negative for acute infarct.  Patchy hyperintensity in the cerebral white matter  bilaterally consistent with chronic microvascular ischemia, relatively mild in degree. No cortical infarct. Brainstem and cerebellum intact. 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  Negative for hemorrhage or mass.  No shift of the midline structures  Ventricle size is normal.  Cerebral volume is normal for age.  Paranasal sinuses are clear.  IMPRESSION: Mild chronic microvascular ischemic change.  No acute abnormality.   Electronically Signed   By: Franchot Gallo M.D.   On: 10/13/2013 18:30    Scheduled Meds: . irbesartan  150 mg Oral Daily   And  . amLODipine  5 mg Oral Daily   And  . hydrochlorothiazide  12.5 mg Oral Daily  . aspirin  325 mg Oral Daily  . atorvastatin  10 mg Oral q1800  . heparin  5,000 Units Subcutaneous 3 times per day  . insulin aspart  0-15 Units Subcutaneous TID WC  . insulin aspart  0-5 Units Subcutaneous QHS  . metoprolol succinate  50 mg Oral Daily  . pantoprazole  40 mg Oral Daily   Continuous Infusions: . sodium chloride Stopped (10/14/13 0940)    Principal Problem:   TIA (transient ischemic attack) Active Problems:   Diabetes   Unspecified essential hypertension   Dysphasia   Facial weakness    Time spent: 25 minutes.     [facility] Pager [phone]. If 7PM-7AM, please contact night-coverage at [email or web address], password [facility] 10/14/2013, 2:26 PM  LOS: 1 day

## 2013-10-14 NOTE — Progress Notes (Signed)
Echo Lab  2D Echocardiogram completed.  [place], RDCS 10/14/2013 9:16 AM

## 2013-10-14 NOTE — Evaluation (Signed)
Physical Therapy Evaluation and D/C Patient Details Name: Cassidy Carlson MRN: [id number] DOB: [birth year] Today's Date: 10/14/2013   History of Present Illness  63 yo female admitted with diplopia, eye lip droop and dysarthria. 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 Pt currently without significant functional limitations and is able to mobilize with supervision and states that she has returned to her baseline status.  Will not follow pt as she functions without need for assistance.  Do not feel that pt needs any further HH or Outpt PT either.  Pt will not need skilled PT at this time.  Will d/c PT.      Follow Up Recommendations No PT follow up    Equipment Recommendations  None recommended by PT    Recommendations for Other Services       Precautions / Restrictions Precautions Precautions: Fall Restrictions Weight Bearing Restrictions: No      Mobility  Bed Mobility Overal bed mobility: Modified Independent                Transfers Overall transfer level: Needs assistance Equipment used: None Transfers: Sit to/from Stand Sit to Stand: Supervision            Ambulation/Gait Ambulation/Gait assistance: Supervision Ambulation Distance (Feet): 250 Feet Assistive device: None Gait Pattern/deviations: Step-through pattern;Decreased stride length   Gait velocity interpretation: <1.8 ft/sec, indicative of risk for recurrent falls General Gait Details: Pt states that she is at baseline.  No significant LOB with challenges to balance.  Pt does not feel that she needs PT.    Stairs            Wheelchair Mobility    Modified Rankin (Stroke Patients Only)       Balance Overall balance assessment: Needs assistance         Standing balance support: No upper extremity supported;During functional activity Standing balance-Leahy Scale: Fair               High level balance  activites: Direction changes;Turns;Backward walking;Head turns High Level Balance Comments: Self corrects when she is slightly unsteady.  Pt reports this is baseline.  She states "I have been dizzy for some time."               Pertinent Vitals/Pain Pain Assessment: No/denies pain    Home Living Family/patient expects to be discharged to:: Private residence Living Arrangements: Spouse/significant other Available Help at Discharge: Family;Available 24 hours/day Type of Home: House         Home Equipment: None      Prior Function Level of Independence: Independent               Hand Dominance   Dominant Hand: Right    Extremity/Trunk Assessment   Upper Extremity Assessment: Defer to OT evaluation           Lower Extremity Assessment: Generalized weakness      Cervical / Trunk Assessment: Normal  Communication   Communication: No difficulties;Prefers language other than English (speaks english but patients family translating during sessio)  Cognition Arousal/Alertness: Awake/alert Behavior During Therapy: WFL for tasks assessed/performed Overall Cognitive Status: Impaired/Different from baseline Area of Impairment: Awareness           Awareness: Emergent   General Comments: Pt with decr awareness to balance deficits and visual changes. Pt states "sometimes has double" pt with immediate diplopia present in Rt visual testing  General Comments      Exercises Other Exercises Other Exercises: vision exercises provided      Assessment/Plan    PT Assessment Patent does not need any further PT services  PT Diagnosis     PT Problem List    PT Treatment Interventions Gait training   PT Goals (Current goals can be found in the Care Plan section) Acute Rehab PT Goals PT Goal Formulation: No goals set, d/c therapy    Frequency     Barriers to discharge        Co-evaluation               End of Session Equipment Utilized During  Treatment: Gait belt Activity Tolerance: Patient tolerated treatment well Patient left: with call bell/phone within reach;with family/visitor present;in bed Nurse Communication: Mobility status         Time: 2549-8264 PT Time Calculation (min): 15 min   Charges:   PT Evaluation $Initial PT Evaluation Tier I: 1 Procedure PT Treatments $Gait Training: 8-22 mins   PT G Codes:          INGOLD,Cassidy Carlson 01-Nov-2013, 4:18 PM [facility] [phone] (pager)

## 2013-10-14 NOTE — Progress Notes (Signed)
INITIAL NUTRITION ASSESSMENT  DOCUMENTATION CODES Per approved criteria  -Not Applicable   INTERVENTION: Encourage healthful PO intake RD to continue to monitor  NUTRITION DIAGNOSIS: Unintentional weight loss related to nausea and vomiting as evidenced by pt's report of 6-7 lb weight loss in 2 weeks.   Goal: Pt to meet >/= 90% of their estimated nutrition needs   Monitor:  PO intake, weight trend, labs  Reason for Assessment: Malnutrition Screening Tool, score of 2  63 y.o. female  Admitting Dx: TIA (transient ischemic attack)  ASSESSMENT: 63 y.o. female , hx of DM and HTN who presented to Urgent care with complaints of lightheadedness with slurred speech and some vision changes that started around 1400 on 8/22. In the ED, pt was found to have an unremarkable head CT. Given risk factors and presenting symptoms, the hospitalist service was consulted for CVA/TIA work up.  Pt reports that her appetite is normal; she is eating okay but, doesn't really like the food here. She states that 2 weeks ago she had nausea and vomiting, she was eating very little and lost 6-7 lbs. Pt is not interested in receiving snacks or nutritional supplements at this time. RD encouraged healthful PO intake. Discussed current diet restrictions and discouraged intake of salt and soy-sauce. Encouraged low fat dairy and lean meats.  Pt appears well-nourished. Labs reviewed.  Height: Ht Readings from Last 1 Encounters:  10/13/13 5\' 2"  (1.575 m)    Weight: Wt Readings from Last 1 Encounters:  10/13/13 143 lb (64.864 kg)    Ideal Body Weight: 110 lbs  % Ideal Body Weight: 130%  Wt Readings from Last 10 Encounters:  10/13/13 143 lb (64.864 kg)  10/13/13 144 lb (65.318 kg)    Usual Body Weight: 150 lbs  % Usual Body Weight: 95%  BMI:  Body mass index is 26.15 kg/(m^2).  Estimated Nutritional Needs: Kcal: 1600-1800 Protein: 70-80 grams Fluid: 1.6-1.8 L/day  Skin: intact  Diet Order:  Dysphagia 3, thin  EDUCATION NEEDS: -No education needs identified at this time  No intake or output data in the 24 hours ending 10/14/13 1640  Last BM: 8/23  Labs:   Recent Labs Lab 10/13/13 1455 10/13/13 1514 10/14/13 0358  NA 136* 135* 138  K 4.6 4.5 4.2  CL 98 105 100  CO2 27  --  28  BUN 13 13 14   CREATININE 0.74 0.80 0.82  CALCIUM 9.7  --  9.1  GLUCOSE 165* 168* 126*    CBG (last 3)   Recent Labs  10/13/13 2226 10/14/13 0647 10/14/13 1157  GLUCAP 263* 126* 150*    Scheduled Meds: . irbesartan  150 mg Oral Daily   And  . amLODipine  5 mg Oral Daily   And  . hydrochlorothiazide  12.5 mg Oral Daily  . aspirin  325 mg Oral Daily  . atorvastatin  10 mg Oral q1800  . heparin  5,000 Units Subcutaneous 3 times per day  . insulin aspart  0-15 Units Subcutaneous TID WC  . insulin aspart  0-5 Units Subcutaneous QHS  . metoprolol succinate  50 mg Oral Daily  . pantoprazole  40 mg Oral Daily    Continuous Infusions: . sodium chloride Stopped (10/14/13 0940)    History reviewed. No pertinent past medical history.  History reviewed. No pertinent past surgical history.  Pryor Ochoa RD, LDN Inpatient Clinical Dietitian Pager: [phone] After Hours Pager: [phone]

## 2013-10-14 NOTE — Progress Notes (Signed)
Pt NIF-40 VC 1.2

## 2013-10-14 NOTE — Progress Notes (Signed)
Subjective: Patient still complaining of a sensation that her food will get stuck when she swallows.  Speech following and placed her on a Dysphagia 3 diet. Patein complained of jaw claudication while eating to SLP. She also complains of mild SOB when laying flat.   Objective: Current vital signs: BP 170/81  Pulse 70  Temp(Src) 98.1 F (36.7 C) (Oral)  Resp 18  Ht 5' 2" (1.575 m)  Wt 64.864 kg (143 lb)  BMI 26.15 kg/m2  SpO2 100% Vital signs in last 24 hours: Temp:  [97.6 F (36.4 C)-98.5 F (36.9 C)] 98.1 F (36.7 C) (08/24 1033) Pulse Rate:  [57-72] 70 (08/24 1033) Resp:  [17-22] 18 (08/24 1033) BP: (116-170)/(60-82) 170/81 mmHg (08/24 1033) SpO2:  [96 %-100 %] 100 % (08/24 1033) Weight:  [64.864 kg (143 lb)-65.318 kg (144 lb)] 64.864 kg (143 lb) (08/23 1422)  Intake/Output from previous day:   Intake/Output this shift:   Nutritional status: Dysphagia  Neurologic Exam: General: NAD,  Ale to count to 34 in one breath.  Mental Status: Alert, oriented, thought content appropriate.  Speech fluent without evidence of aphasia.  Able to follow 3 step commands without difficulty. Cranial Nerves: II: Visual fields grossly normal, pupils equal, round, reactive to light and accommodation III,IV, VI: ptosis not present, extra-ocular motions intact bilaterally, no fatigue when looking upward.  V,VII: smile symmetric, facial light touch sensation normal bilaterally VIII: hearing normal bilaterally IX,X: gag reflex present XI: bilateral shoulder shrug XII: midline tongue extension without atrophy or fasciculations  Motor: Right : Upper extremity   5/5    Left:     Upper extremity   5/5  Lower extremity   5/5     Lower extremity   5/5 Tone and bulk:normal tone throughout; no atrophy noted Sensory: Pinprick and light touch intact throughout, bilaterally Deep Tendon Reflexes:  Right: Upper Extremity   Left: Upper extremity   biceps (C-5 to C-6) 2/4   biceps (C-5 to C-6)  2/4 tricep (C7) 2/4    triceps (C7) 2/4 Brachioradialis (C6) 2/4  Brachioradialis (C6) 2/4  Lower Extremity Lower Extremity  quadriceps (L-2 to L-4) 2/4   quadriceps (L-2 to L-4) 2/4 Achilles (S1) 2/4   Achilles (S1) 2/4  Plantars: Right: downgoing   Left: downgoing Cerebellar: normal finger-to-nose,  normal heel-to-shin test    Lab Results: Basic Metabolic Panel:  Recent Labs Lab 10/13/13 1455 10/13/13 1514 10/14/13 0358  NA 136* 135* 138  K 4.6 4.5 4.2  CL 98 105 100  CO2 27  --  28  GLUCOSE 165* 168* 126*  BUN _0 CREATININE 0.74 0.80 0.82  CALCIUM 9.7  --  9.1    Liver Function Tests:  Recent Labs Lab 10/13/13 1455 10/14/13 0358  AST 22 20  ALT 26 22  ALKPHOS 91 72  BILITOT 0.3 0.4  PROT 8.0 7.1  ALBUMIN 3.9 3.5   No results found for this basename: LIPASE, AMYLASE,  in the last 168 hours No results found for this basename: AMMONIA,  in the last 168 hours  CBC:  Recent Labs Lab 10/13/13 1455 10/13/13 1514 10/14/13 0358  WBC 7.1  --  7.1  NEUTROABS 3.8  --   --   HGB 13.4 13.6 12.6  HCT 38.0 40.0 35.8*  MCV 85.4  --  87.5  PLT 275  --  264    Cardiac Enzymes:  Recent Labs Lab 10/13/13 1927  CKTOTAL 102    Lipid Panel:  Recent  Labs Lab 10/14/13 0358  CHOL 165  TRIG 175*  HDL 33*  CHOLHDL 5.0  VLDL 35  LDLCALC 97    CBG:  Recent Labs Lab 10/13/13 2226 10/14/13 0647  GLUCAP 263* 126*    Microbiology: No results found for this or any previous visit.  Coagulation Studies:  Recent Labs  10/13/13 1455  LABPROT 13.1  INR 0.99    Imaging: Ct Head Wo Contrast  10/13/2013   CLINICAL DATA:  Slurred speech  EXAM: CT HEAD WITHOUT CONTRAST  TECHNIQUE: Contiguous axial images were obtained from the base of the skull through the vertex without intravenous contrast.  COMPARISON:  None.  FINDINGS: Ventricles are normal in size and configuration. There is no mass, hemorrhage, extra-axial fluid collection, or midline  shift. There is rather minimal small vessel disease in the centra semiovale bilaterally. Elsewhere gray-white compartments are normal. No acute infarct is apparent. Bony calvarium appears intact. The mastoid air cells are clear.  IMPRESSION: Rather minimal periventricular small vessel disease. No demonstrable intracranial mass, hemorrhage, or acute appearing infarct.   Electronically Signed   By: Lowella Grip M.D.   On: 10/13/2013 15:37   Mr Brain Wo Contrast  10/13/2013   CLINICAL DATA:  Stroke.  Slurred speech and vision change.  EXAM: MRI HEAD WITHOUT CONTRAST  TECHNIQUE: Multiplanar, multiecho pulse sequences of the brain and surrounding structures were obtained without intravenous contrast.  COMPARISON:  CT head 10/13/2013  FINDINGS: Negative for acute infarct.  Patchy hyperintensity in the cerebral white matter bilaterally consistent with chronic microvascular ischemia, relatively mild in degree. No cortical infarct. Brainstem and cerebellum intact. 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  Negative for hemorrhage or mass.  No shift of the midline structures  Ventricle size is normal.  Cerebral volume is normal for age.  Paranasal sinuses are clear.  IMPRESSION: Mild chronic microvascular ischemic change.  No acute abnormality.   Electronically Signed   By: Franchot Gallo M.D.   On: 10/13/2013 18:30    Medications:  Scheduled: . irbesartan  150 mg Oral Daily   And  . amLODipine  5 mg Oral Daily   And  . hydrochlorothiazide  12.5 mg Oral Daily  . aspirin  325 mg Oral Daily  . atorvastatin  10 mg Oral q1800  . heparin  5,000 Units Subcutaneous 3 times per day  . insulin aspart  0-15 Units Subcutaneous TID WC  . insulin aspart  0-5 Units Subcutaneous QHS  . metoprolol succinate  50 mg Oral Daily  . pantoprazole  40 mg Oral Daily    Assessment/Plan:  63 y.o. female with a history of diabetes mellitus and hypertension presenting with dysarthria (currently speech is clear and SLP  feels clear) but patient feels it still is off. Patient still feeling she is having problems with swallowing (SLP has placed on a dysphagia 3 diet). No complaints of diplopia today.  MRI brain is negative. Concern remains this may be a myopathic process causing dysarthria in addition to facial weakness, such as myasthenia gravis, particularly with her history of intermittent diplopia and lid ptosis. ESR 33, CK WNL.    Plan:   1. Prophylactic therapy-Antiplatelet med: Aspirin  2. Myasthenia gravis antibody study  3. NIF and VC Q12      Etta Quill PA-C [facility] [phone]  10/14/2013, 11:34 AM

## 2013-10-14 NOTE — Discharge Instructions (Signed)
Benign Positional Vertigo Vertigo means you feel like you or your surroundings are moving when they are not. Benign positional vertigo is the most common form of vertigo. Benign means that the cause of your condition is not serious. Benign positional vertigo is more common in older adults. CAUSES  Benign positional vertigo is the result of an upset in the labyrinth system. This is an area in the middle ear that helps control your balance. This may be caused by a viral infection, head injury, or repetitive motion. However, often no specific cause is found. SYMPTOMS  Symptoms of benign positional vertigo occur when you move your head or eyes in different directions. Some of the symptoms may include:  Loss of balance and falls.  Vomiting.  Blurred vision.  Dizziness.  Nausea.  Involuntary eye movements (nystagmus). DIAGNOSIS  Benign positional vertigo is usually diagnosed by physical exam. If the specific cause of your benign positional vertigo is unknown, your caregiver may perform imaging tests, such as magnetic resonance imaging (MRI) or computed tomography (CT). TREATMENT  Your caregiver may recommend movements or procedures to correct the benign positional vertigo. Medicines such as meclizine, benzodiazepines, and medicines for nausea may be used to treat your symptoms. In rare cases, if your symptoms are caused by certain conditions that affect the inner ear, you may need surgery. HOME CARE INSTRUCTIONS   Follow your caregiver's instructions.  Move slowly. Do not make sudden body or head movements.  Avoid driving.  Avoid operating heavy machinery.  Avoid performing any tasks that would be dangerous to you or others during a vertigo episode.  Drink enough fluids to keep your urine clear or pale yellow. SEEK IMMEDIATE MEDICAL CARE IF:   You develop problems with walking, weakness, numbness, or using your arms, hands, or legs.  You have difficulty speaking.  You develop  severe headaches.  Your nausea or vomiting continues or gets worse.  You develop visual changes.  Your family or friends notice any behavioral changes.  Your condition gets worse.  You have a fever.  You develop a stiff neck or sensitivity to light. MAKE SURE YOU:   Understand these instructions.  Will watch your condition.  Will get help right away if you are not doing well or get worse. Document Released: 11/15/2005 Document Revised: 05/02/2011 Document Reviewed: 10/28/2010 [facility] Patient Information 2015 [place], [place]. This information is not intended to replace advice given to you by your health care provider. Make sure you discuss any questions you have with your health care provider.  Aphasia Aphasia is a neurological disorder caused by damage to the parts of the brain that control language. CAUSES  Aphasia is not a disease, but a symptom of brain damage. Aphasia is commonly seen in adults who have suffered a stroke. Aphasia also can result from:  A brain tumor.  Infection.  Head injury.  A rare type of dementia called Primary Progressive Aphasia. Common types of dementia may be associated with aphasia but can also exist without language problems. SYMPTOMS  Primary signs of the disorder include:  Problems expressing oneself when speaking.  Trouble understanding speech.  Difficulty with reading and writing.  Speaking in short or incomplete sentences.  Speaking in sentences that don't make sense.  Speaking unrecognizable words.  Interpreting figurative language literally.  Writing sentences that don't make sense. The type and severity of language problems depend on the precise location and extent of the damaged brain tissue. Aphasia can be divided into four broad categories:  Expressive  aphasia - difficulty in conveying thoughts through speech or writing. The patient knows what they want to say, but cannot find the words they need.  Receptive aphasia  - difficulty understanding spoken or written language. The patient hears the voice or sees the print but cannot make sense of the words.  Anomic or amnesia aphasia - difficulty in using the correct names for particular objects, people, places, or events. This is the least severe form of aphasia.  Global aphasia results from severe and extensive damage to the language areas of the brain. Patients lose almost all language function, both comprehension (understanding) and expression. They cannot speak, understand speech, read, or write. TREATMENT  Sometimes an individual will completely recover from aphasia without treatment. In most cases, language therapy should begin as soon as possible. Language therapy should be tailored to the individual needs of the patient. Therapy with a speech pathologist involves exercises in which patients:  Read.  Write.  Follow directions.  Repeat what they hear.  Computer-aided therapy may also be used. PROGNOSIS  The outcome of aphasia is difficult to predict. People who are younger or have less extensive brain damage do better. The location of the injury is also important. The location is a clue to prognosis. In general, patients tend to recover skills in language comprehension (understanding) more completely than those skills involving expression (speaking or writing). Document Released: 10/30/2001 Document Revised: 05/02/2011 Document Reviewed: 04/29/2013 [facility] Patient Information 2015 [place], [place]. This information is not intended to replace advice given to you by your health care provider. Make sure you discuss any questions you have with your health care provider.  Aphasia Aphasia is a neurological disorder caused by damage to the parts of the brain that control language. CAUSES  Aphasia is not a disease, but a symptom of brain damage. Aphasia is commonly seen in adults who have suffered a stroke. Aphasia also can result from:  A brain  tumor.  Infection.  Head injury.  A rare type of dementia called Primary Progressive Aphasia. Common types of dementia may be associated with aphasia but can also exist without language problems. SYMPTOMS  Primary signs of the disorder include:  Problems expressing oneself when speaking.  Trouble understanding speech.  Difficulty with reading and writing.  Speaking in short or incomplete sentences.  Speaking in sentences that don't make sense.  Speaking unrecognizable words.  Interpreting figurative language literally.  Writing sentences that don't make sense. The type and severity of language problems depend on the precise location and extent of the damaged brain tissue. Aphasia can be divided into four broad categories:  Expressive aphasia - difficulty in conveying thoughts through speech or writing. The patient knows what they want to say, but cannot find the words they need.  Receptive aphasia - difficulty understanding spoken or written language. The patient hears the voice or sees the print but cannot make sense of the words.  Anomic or amnesia aphasia - difficulty in using the correct names for particular objects, people, places, or events. This is the least severe form of aphasia.  Global aphasia results from severe and extensive damage to the language areas of the brain. Patients lose almost all language function, both comprehension (understanding) and expression. They cannot speak, understand speech, read, or write. TREATMENT  Sometimes an individual will completely recover from aphasia without treatment. In most cases, language therapy should begin as soon as possible. Language therapy should be tailored to the individual needs of the patient. Therapy with a speech  pathologist involves exercises in which patients:  Read.  Write.  Follow directions.  Repeat what they hear.  Computer-aided therapy may also be used. PROGNOSIS  The outcome of aphasia is  difficult to predict. People who are younger or have less extensive brain damage do better. The location of the injury is also important. The location is a clue to prognosis. In general, patients tend to recover skills in language comprehension (understanding) more completely than those skills involving expression (speaking or writing). Document Released: 10/30/2001 Document Revised: 05/02/2011 Document Reviewed: 04/29/2013 [facility] Patient Information 2015 [place], [place]. This information is not intended to replace advice given to you by your health care provider. Make sure you discuss any questions you have with your health care provider.

## 2013-10-14 NOTE — Evaluation (Signed)
Clinical/Bedside Swallow Evaluation Patient Details  Name: Cassidy Carlson MRN: [id number] Date of Birth: Jun 23, 1950  Today's Date: 10/14/2013 Time: 3086-5784 SLP Time Calculation (min): 22 min  Past Medical History: History reviewed. No pertinent past medical history. Past Surgical History: History reviewed. No pertinent past surgical history. HPI:  Cassidy Carlson is a 63 y.o. female history of hypertension and hyperlipidemia presenting with new-onset dysarthria which started about 2 PM on 10/12/2013.  She indicates she has been experiencing double vision occasionally, as well as slight drooping of her eyelids. She's also had slight difficulty with swallowing but no choking. MRI negative for acute CVA.   Assessment / Plan / Recommendation Clinical Impression  Pt presents with mild, generalized oral weakness that does not appear to impact functional swallow with Dys 3 textures. Pt did decline trials of regular textures, stating that this would be too difficult at baseline. Pt and family share that at baseline she will occasionally cough with dry, crumbly foods and that her jaw tires as she eats. Pt also with c/o a globus sensation at baseline, which may be secondary to reported reflux. An immediate cough was noted x1 after several consecutive straw sips. Min verbal/visual cues from SLP for small, single sips appeared effective at increasing airway protection as no further coughing was observed. Recommend Dys 3 textures for energy conservation, and thin liquids via single sips. SLP to follow briefly for tolerance.  Of note, patient and family report that speech has not yet returned to baseline. Per patient report, it is not "as fluent as normal." Pt's speech was intelligible and fluent during brief screen, however per discussion with PA will return for more thorough assessment.    Aspiration Risk  Moderate    Diet Recommendation Dysphagia 3 (Mechanical Soft);Thin liquid   Liquid Administration  via: Cup;Straw Medication Administration: Whole meds with puree Supervision: Patient able to self feed;Intermittent supervision to cue for compensatory strategies Compensations: Slow rate;Small sips/bites Postural Changes and/or Swallow Maneuvers: Seated upright 90 degrees;Upright 30-60 min after meal    Other  Recommendations Oral Care Recommendations: Oral care BID   Follow Up Recommendations  Other (comment) (TBD)    Frequency and Duration min 2x/week  2 weeks   Pertinent Vitals/Pain n/a    SLP Swallow Goals     Swallow Study Prior Functional Status       General Date of Onset: 10/12/13 HPI: Cassidy Carlson is a 63 y.o. female history of hypertension and hyperlipidemia presenting with new-onset dysarthria which started about 2 PM on 10/12/2013.  She indicates she has been experiencing double vision occasionally, as well as slight drooping of her eyelids. She's also had slight difficulty with swallowing but no choking. MRI negative for acute CVA. Type of Study: Bedside swallow evaluation Previous Swallow Assessment: none in chart Diet Prior to this Study: Regular;Thin liquids Temperature Spikes Noted: No Respiratory Status: Room air History of Recent Intubation: No Behavior/Cognition: Alert;Cooperative;Pleasant mood;Requires cueing Oral Cavity - Dentition: Adequate natural dentition;Missing dentition Self-Feeding Abilities: Able to feed self Patient Positioning: Upright in bed Baseline Vocal Quality: Low vocal intensity Volitional Cough: Strong Volitional Swallow: Able to elicit    Oral/Motor/Sensory Function Overall Oral Motor/Sensory Function: Impaired Labial ROM: Within Functional Limits Labial Symmetry: Within Functional Limits Labial Strength: Reduced Lingual ROM: Reduced right;Reduced left Lingual Symmetry: Within Functional Limits Lingual Strength: Reduced Facial ROM: Within Functional Limits Facial Symmetry: Within Functional Limits Facial Strength:  Reduced Velum: Within Functional Limits Mandible: Within Functional Limits   Ice Chips Ice chips: Not tested  Thin Liquid Thin Liquid: Impaired Presentation: Cup;Self Fed;Straw Pharyngeal  Phase Impairments: Suspected delayed Swallow;Cough - Immediate (cough x1 s/p several consecutive straw sips)    Nectar Thick Nectar Thick Liquid: Not tested   Honey Thick Honey Thick Liquid: Not tested   Puree Puree: Within functional limits Presentation: Self Fed;Spoon   Solid   GO    Solid: Within functional limits (pt declined regular texture, said it's too hard at baseline) Presentation: Self Fed        Germain Osgood, M.A. CCC-SLP [phone]  Germain Osgood 10/14/2013,11:32 AM

## 2013-10-15 NOTE — Care Management Note (Signed)
    Page 1 of 1   10/15/2013     2:58:43 PM CARE MANAGEMENT NOTE 10/15/2013  Patient:  Cassidy Carlson, Cassidy Carlson   Account Number:  [id number]  Date Initiated:  10/15/2013  Documentation initiated by:  Lorne Skeens  Subjective/Objective Assessment:   Patient was admitted for dysphagia. Lives at home with husband.     Action/Plan:   Will follow for discharge needs pending PT/OT evals and physician orderds.   Anticipated DC Date:  10/14/2013   Anticipated DC Plan:  [place]  CM consult      Choice offered to / List presented to:             Status of service:   Medicare Important Message given?   (If response is "NO", the following Medicare IM given date fields will be blank) Date Medicare IM given:   Medicare IM given by:   Date Additional Medicare IM given:   Additional Medicare IM given by:    Discharge Disposition:    Per UR Regulation:    If discussed at Long Length of Stay Meetings, dates discussed:    Comments:  10/15/13 [name], MSN, CM- Recieved a call from patient's husband stating that they are interested in outpatient neuro rehab. Referral was faxed.    10/15/13 [name],MSN, CM- Spoke with patient via phone, as she was discharged after hours. Patient is unsure whether she wants outpatient therapy and would like to discuss it with her husband.  Awaiting return call with decision.

## 2013-10-17 NOTE — ED Provider Notes (Signed)
Medical screening examination/treatment/procedure(s) were conducted as a shared visit with non-physician practitioner(s) and myself.  I personally evaluated the patient during the encounter.   EKG Interpretation   Date/Time:  Sunday October 13 2013 14:40:41 EDT Ventricular Rate:  63 PR Interval:  174 QRS Duration: 90 QT Interval:  411 QTC Calculation: 421 R Axis:   54 Text Interpretation:  Sinus rhythm RSR' in V1 or V2, probably normal  variant Similar to prior Confirmed by [facility]  MD, [place] [phone]) on  10/14/2013 12:16:48 AM      Patient here with heavy tongue sensation, mild slurred speech for family. Began roughly 3 days ago. CT Head ok. MR ordered, admitted for concerns of stroke.  Evelina Bucy, MD 10/17/13 0005

## 2013-10-19 LAB — MYASTHENIA GRAVIS PANEL 2
Acetylchol Block Ab: <15 % of inhibition (ref <15)
Acetylcholine Modulat Ab: 24 %

## 2013-10-22 ENCOUNTER — Encounter: Payer: Self-pay | Admitting: Neurology

## 2013-10-22 ENCOUNTER — Ambulatory Visit (INDEPENDENT_AMBULATORY_CARE_PROVIDER_SITE_OTHER): Payer: 59 | Admitting: Neurology

## 2013-10-22 VITALS — BP 110/68 | HR 74 | Ht 61.0 in | Wt 140.1 lb

## 2013-10-22 DIAGNOSIS — R29810 Facial weakness: Secondary | ICD-10-CM

## 2013-10-22 DIAGNOSIS — H02403 Unspecified ptosis of bilateral eyelids: Secondary | ICD-10-CM

## 2013-10-22 DIAGNOSIS — R4702 Dysphasia: Secondary | ICD-10-CM

## 2013-10-22 DIAGNOSIS — R4789 Other speech disturbances: Secondary | ICD-10-CM

## 2013-10-22 DIAGNOSIS — H02409 Unspecified ptosis of unspecified eyelid: Secondary | ICD-10-CM

## 2013-10-22 LAB — TSH: TSH: 0.71 u[IU]/mL (ref 0.350–4.500)

## 2013-10-22 LAB — C-REACTIVE PROTEIN

## 2013-10-22 NOTE — Discharge Summary (Signed)
Physician Discharge Summary  Cassidy Carlson EPP:[id number] DOB: 1951/01/16 DOA: 10/13/2013  PCP: No PCP Per Patient  Admit date: 10/13/2013 Discharge date: 10/14/2013  Time spent: 20 minutes  Recommendations for Outpatient Follow-up:  1. Follow up with PCP ino ne week. 2. Follow up with neuro as recommended.   Discharge Diagnoses:  Principal Problem:   TIA (transient ischemic attack) Active Problems:   Diabetes   Unspecified essential hypertension   Dysphasia   Facial weakness   Discharge Condition: improved.   Diet recommendation: regular.   Filed Weights   10/13/13 1422  Weight: 64.864 kg (143 lb)    History of present illness:  *Cassidy Carlson is a 63 y.o. female  With a hx of DM and HTN who presented to Urgent care with complaints of lightheadedness with slurred speech and some vision changes that started around 1400 on 8/22. Sx gradually subsided, however, pt presented for further work up and was subsequently referred to the ED for eval   Hospital Course:   1. TIA ? Myasthenia gravis: - admitted to telemetry and MRI done, negative for acute stroke. Prophylactic aspirin on board. Myasthenia gravis antibody pending. NIF.  2. Hypertension: controlled.   Procedures:  MRI NEGATIVE.   Consultations: neurology  Discharge Exam: Filed Vitals:   10/14/13 1845  BP: 140/62  Pulse: 66  Temp: 97.9 F (36.6 C)  Resp: 18    General: alert afebrile comfortable Cardiovascular: s1s2 Respiratory: ctab  Discharge Instructions You were cared for by a hospitalist during your hospital stay. If you have any questions about your discharge medications or the care you received while you were in the hospital after you are discharged, you can call the unit and asked to speak with the hospitalist on call if the hospitalist that took care of you is not available. Once you are discharged, your primary care physician will handle any further medical issues. Please note that NO REFILLS  for any discharge medications will be authorized once you are discharged, as it is imperative that you return to your primary care physician (or establish a relationship with a primary care physician if you do not have one) for your aftercare needs so that they can reassess your need for medications and monitor your lab values.  Discharge Instructions   Diet - low sodium heart healthy    Complete by:  As directed      Increase activity slowly    Complete by:  As directed             Medication List    STOP taking these medications       CALCIUM-VITAMIN D PO     multivitamin with minerals Tabs tablet      TAKE these medications       aspirin 81 MG tablet  Take 1 tablet (81 mg total) by mouth daily.     esomeprazole 40 MG capsule  Commonly known as:  NEXIUM  Take 40 mg by mouth daily at 12 noon.     metoprolol succinate 50 MG 24 hr tablet  Commonly known as:  TOPROL-XL  Take 50 mg by mouth daily. Take with or immediately following a meal.     ranitidine 75 MG tablet  Commonly known as:  ZANTAC  Take 150 mg by mouth daily as needed for heartburn.     rosuvastatin 10 MG tablet  Commonly known as:  CRESTOR  Take 10 mg by mouth daily.     sitaGLIPtin 100 MG tablet  Commonly  known as:  JANUVIA  Take 100 mg by mouth daily.     THIAMINE HCL PO  Take 1 tablet by mouth daily.     TRIBENZOR 20-5-12.5 MG Tabs  Generic drug:  Olmesartan-Amlodipine-HCTZ  Take 1 tablet by mouth daily.       Allergies  Allergen Reactions  . Sulfa Antibiotics Rash  . Pork-Derived Products        Follow-up Information   Follow up with pcp. Schedule an appointment as soon as possible for a visit in 1 week.      Follow up with [facility]. Schedule an appointment as soon as possible for a visit in 2 weeks.   Contact information:   [facility]   Neurologist Address: [address] [place], [place] [address] [phone]       The results of  significant diagnostics from this hospitalization (including imaging, microbiology, ancillary and laboratory) are listed below for reference.    Significant Diagnostic Studies: Ct Head Wo Contrast  10/13/2013   CLINICAL DATA:  Slurred speech  EXAM: CT HEAD WITHOUT CONTRAST  TECHNIQUE: Contiguous axial images were obtained from the base of the skull through the vertex without intravenous contrast.  COMPARISON:  None.  FINDINGS: Ventricles are normal in size and configuration. There is no mass, hemorrhage, extra-axial fluid collection, or midline shift. There is rather minimal small vessel disease in the centra semiovale bilaterally. Elsewhere gray-white compartments are normal. No acute infarct is apparent. Bony calvarium appears intact. The mastoid air cells are clear.  IMPRESSION: Rather minimal periventricular small vessel disease. No demonstrable intracranial mass, hemorrhage, or acute appearing infarct.   Electronically Signed   By: Lowella Grip M.D.   On: 10/13/2013 15:37   Mr Brain Wo Contrast  10/13/2013   CLINICAL DATA:  Stroke.  Slurred speech and vision change.  EXAM: MRI HEAD WITHOUT CONTRAST  TECHNIQUE: Multiplanar, multiecho pulse sequences of the brain and surrounding structures were obtained without intravenous contrast.  COMPARISON:  CT head 10/13/2013  FINDINGS: Negative for acute infarct.  Patchy hyperintensity in the cerebral white matter bilaterally consistent with chronic microvascular ischemia, relatively mild in degree. No cortical infarct. Brainstem and cerebellum intact. 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  Negative for hemorrhage or mass.  No shift of the midline structures  Ventricle size is normal.  Cerebral volume is normal for age.  Paranasal sinuses are clear.  IMPRESSION: Mild chronic microvascular ischemic change.  No acute abnormality.   Electronically Signed   By: Franchot Gallo M.D.   On: 10/13/2013 18:30    Microbiology: No results found for this or any  previous visit (from the past 240 hour(s)).   Labs: Basic Metabolic Panel: No results found for this basename: NA, K, CL, CO2, GLUCOSE, BUN, CREATININE, CALCIUM, MG, PHOS,  in the last 168 hours Liver Function Tests: No results found for this basename: AST, ALT, ALKPHOS, BILITOT, PROT, ALBUMIN,  in the last 168 hours No results found for this basename: LIPASE, AMYLASE,  in the last 168 hours No results found for this basename: AMMONIA,  in the last 168 hours CBC: No results found for this basename: WBC, NEUTROABS, HGB, HCT, MCV, PLT,  in the last 168 hours Cardiac Enzymes: No results found for this basename: CKTOTAL, CKMB, CKMBINDEX, TROPONINI,  in the last 168 hours BNP: BNP (last 3 results) No results found for this basename: PROBNP,  in the last 8760 hours CBG: No results found for this basename: GLUCAP,  in the last 168 hours     Signed:  Renell Coaxum  [facility] 10/22/2013, 8:06 PM

## 2013-10-22 NOTE — Patient Instructions (Signed)
1.  Check blood work today 2.  EMG of the right arm and leg 3.  Try to stay in cool temperatures 4.  If your shortness of breath worsens, go to the emergency department 5.  Return to clinic in 3 weeks

## 2013-10-22 NOTE — Progress Notes (Signed)
[facility] Clinic Note - Initial Visit   Date: 10/23/2013  Cassidy Carlson MRN: [id number] DOB: 1950/06/01   Dear Dr. Dagmar Hait:   Thank you for your kind referral of Cassidy Carlson for consultation of progressive diplopia, dysarthria, and dysphasia. Although her history is well known to you, please allow [place] to reiterate it for the purpose of our medical record. The patient was accompanied to the clinic by husband who also provides collateral information.     History of Present Illness: Cassidy Carlson is a 63 y.o. right-handed [place] female with history of diabetes mellitus (diagnosed 2014), hyperlipidemia, GERD and hypertension presenting for evaluation of double vision, difficulty talking, and difficulty swallowing.    Starting on 10/12/2013, she felt weakness of the tongue, double vision, difficulty keeping her eyes, and speech difficulty.  She went to the emergency department where she was admitted overnight and had MRI brain which did not show acute stroke.  Labs including CK and ACRh antibodies (binding, blocking, and modulating) were nondiagnostic and she was recommended to follow up with neurology for further evaluation.  Currently, she continues to feels that her speech is "tangled", has generalized fatigue, and complains of double vision, described images on top of each other.  Tongue movements are slowed, feels as if something inside through.  Of late, she has started to sleep on 4 pillows because laying flat makes her short of breath.  She has no weakness of her arms and legs, but does describe generalized fatigue.   Past Medical History  Diagnosis Date  . Diabetes mellitus   . Hypertension   . Hyperlipidemia   . GERD (gastroesophageal reflux disease)     Past Surgical History  Procedure Laterality Date  . Abdominal hysterectomy       Medications:  Current Outpatient Prescriptions on File Prior to Visit  Medication Sig Dispense Refill  .  aspirin 81 MG tablet Take 1 tablet (81 mg total) by mouth daily.  60 tablet  2  . esomeprazole (NEXIUM) 40 MG capsule Take 40 mg by mouth daily at 12 noon.      . metoprolol succinate (TOPROL-XL) 50 MG 24 hr tablet Take 50 mg by mouth daily. Take with or immediately following a meal.      . Olmesartan-Amlodipine-HCTZ (TRIBENZOR) 20-5-12.5 MG TABS Take 1 tablet by mouth daily.       . ranitidine (ZANTAC) 75 MG tablet Take 150 mg by mouth daily as needed for heartburn.      . rosuvastatin (CRESTOR) 10 MG tablet Take 10 mg by mouth daily.      . sitaGLIPtin (JANUVIA) 100 MG tablet Take 100 mg by mouth daily.      . THIAMINE HCL PO Take 1 tablet by mouth daily.       No current facility-administered medications on file prior to visit.    Allergies:  Allergies  Allergen Reactions  . Sulfa Antibiotics Rash  . Pork-Derived Products     Family History: Family History  Problem Relation Age of Onset  . Heart disease Father     Deceased, 23  . Hypertension Mother     Living, 53  . Diabetes Sister     x 2  . Healthy Brother   . Healthy Son   . Healthy Daughter     Social History: History   Social History  . Marital Status: Married    Spouse Name: N/A    Number of Children: N/A  . Years of Education: N/A  Occupational History  . Not on file.   Social History Main Topics  . Smoking status: Never Smoker   . Smokeless tobacco: Not on file  . Alcohol Use: No  . Drug Use: No  . Sexual Activity: Not on file   Other Topics Concern  . Not on file   Social History Narrative   She lives with husband and mother-in-law.  They have two grown children.   She is working part-time as a [employer] loss adjuster.   She moved from [place] with family in 1988.  Native language is Bengali.    Review of Systems:  CONSTITUTIONAL: No fevers, chills, night sweats, or weight loss.   EYES: + visual changes or eye pain ENT: No hearing changes.  No history of nose bleeds.   RESPIRATORY: No cough,  wheezing + shortness of breath.   CARDIOVASCULAR: Negative for chest pain, and palpitations.   GI: Negative for abdominal discomfort, blood in stools or black stools.  No recent change in bowel habits.   GU:  No history of incontinence.   MUSCLOSKELETAL: No history of joint pain or swelling.  No myalgias.   SKIN: Negative for lesions, rash, and itching.   HEMATOLOGY/ONCOLOGY: Negative for prolonged bleeding, bruising easily, and swollen nodes.  No history of cancer.   ENDOCRINE: Negative for cold or heat intolerance, polydipsia or goiter.   PSYCH:  No depression or anxiety symptoms.   NEURO: As Above.   Vital Signs:  BP 110/68  Pulse 74  Ht 5\' 1"  (1.549 m)  Wt 140 lb 1 oz (63.532 kg)  BMI 26.48 kg/m2  SpO2 95%    General Medical Exam:   General:  Well appearing, comfortable.   Eyes/ENT: see cranial nerve examination.   Neck: No masses appreciated.  Full range of motion without tenderness.  No carotid bruits. Respiratory:  Clear to auscultation, good air entry bilaterally. She is able to count to 22 on deep inhalation   Cardiac:  Regular rate and rhythm, no murmur.   Extremities:  No deformities, edema, or skin discoloration. Good capillary refill.   Skin:  Skin color, texture, turgor normal. No rashes or lesions.  Neurological Exam: MENTAL STATUS including orientation to time, place, person, recent and remote memory, attention span and concentration, language, and fund of knowledge is normal.  Speech is moderately nasal in quality and slow.   CRANIAL NERVES: II:  No visual field defects.  Unremarkable fundi.   III-IV-VI: Pupils equal round and reactive to light.  Eyes are conjugate however there is evidence of mild ophthalmoplegia especially with lateral gaze and upgaze. No nystagmus.  Moderate bilateral ptosis improved with ice pack test.   V:  Normal facial sensation.  Jaw jerk is absent.   VII:  Normal facial symmetry and movements. Orbicularis oculi and orbicularis oris is  5/5. Mild weakness of buccinator muscles.  No pathologic facial reflexes.  VIII:  Normal hearing and vestibular function.   IX-X:  Normal palatal movement.   XI:  Normal shoulder shrug and head rotation.   XII:  Moderate weakness of tongue strength 4/5, tongue movements are slowed.    MOTOR:  No atrophy, fasciculations or abnormal movements.  No pronator drift.  Tone is normal.   There is no fatigability on proximal muscle testing. Neck flexion and extension is 5/5 Right Upper Extremity:    Left Upper Extremity:    Deltoid  5/5   Deltoid  5/5   Biceps  5/5   Biceps  5/5  Triceps  5/5   Triceps  5/5   Wrist extensors  5/5   Wrist extensors  5/5   Wrist flexors  5/5   Wrist flexors  5/5   Finger extensors  5/5   Finger extensors  5/5   Finger flexors  5/5   Finger flexors  5/5   Dorsal interossei  5/5   Dorsal interossei  5/5   Abductor pollicis  5/5   Abductor pollicis  5/5   Tone (Ashworth scale)  0  Tone (Ashworth scale)  0   Right Lower Extremity:    Left Lower Extremity:    Hip flexors  5/5   Hip flexors  5/5   Hip extensors  5/5   Hip extensors  5/5   Knee flexors  5/5   Knee flexors  5/5   Knee extensors  5/5   Knee extensors  5/5   Dorsiflexors  5/5   Dorsiflexors  5/5   Plantarflexors  5/5   Plantarflexors  5/5   Toe extensors  5/5   Toe extensors  5/5   Toe flexors  5/5   Toe flexors  5/5   Tone (Ashworth scale)  0  Tone (Ashworth scale)  0   MSRs:  Right                                                                 Left brachioradialis 2+  brachioradialis 2+  biceps 2+  biceps 2+  triceps 2+  triceps 2+  patellar 2+  patellar 2+  ankle jerk 2+  ankle jerk 2+  Hoffman no  Hoffman no  plantar response down  plantar response down   SENSORY:  Normal and symmetric perception of light touch, pinprick, vibration, and proprioception.  Romberg's sign absent.   COORDINATION/GAIT: Normal finger-to- nose-finger and heel-to-shin.  Intact rapid alternating movements  bilaterally.  Able to rise from a chair without using arms.  Gait narrow based and stable. Tandem and stressed gait intact.   Data: Myasthenia AChR binding, blocking, and modulating antibody 10/13/2013:  Negative Lab Results  Component Value Date   CKTOTAL 102 10/13/2013   MRI brain 10/13/2013:  Mild chronic microvascular ischemic change. No acute abnormality.    IMPRESSION: Cassidy Carlson is a 63 year old female referred for evaluation of fatigue, diplopia, dysarthria, and dysphagia. Her examination shows moderate bilateral ptosis which is improved with ice pack test. There is extraocular and double facial muscle weakness. Motor strength of her extremities are intact. Deep tendon reflexes and sensation is normal throughout. She recently underwent MRI of the brain which did not show any abnormalities. Although her acetylcholine receptor antibodies were negative, based on her exam and history, I have high clinical suspicion for myasthenia gravis.   Because of her severity of symptoms, I will schedule her for EMG tomorrow with repetitive nerve stimulation. Additionally, I will check TSH, CRP, and MUSK antibody.  If her EMG confirms neuromuscular junction disorder, CT of the chest will be ordered to look for thymoma and she will be started on Mestinon as well as prednisone.  If symptoms worsen especially regarding shortness of breath, I have instructed her to go to the nearest emergency department.  I will see her back after her EMG to discuss the next in management.  The duration of this appointment visit was 60 minutes of face-to-face time with the patient.  Greater than 50% of this time was spent in counseling, explanation of diagnosis, planning of further management, and coordination of care.   Thank you for allowing me to participate in patient's care.  If I can answer any additional questions, I would be pleased to do so.    Sincerely,    Donika K. Posey Pronto, DO

## 2013-10-23 ENCOUNTER — Ambulatory Visit (INDEPENDENT_AMBULATORY_CARE_PROVIDER_SITE_OTHER): Payer: 59 | Admitting: Neurology

## 2013-10-23 DIAGNOSIS — R4702 Dysphasia: Secondary | ICD-10-CM

## 2013-10-23 DIAGNOSIS — R4789 Other speech disturbances: Secondary | ICD-10-CM

## 2013-10-23 DIAGNOSIS — R29810 Facial weakness: Secondary | ICD-10-CM

## 2013-10-23 NOTE — Procedures (Signed)
[facility]  [place]  [place], [place] [address] Tel: [phone] Fax:  [phone] Test Date:  10/23/2013  Patient: Cassidy Carlson DOB: 1950/03/01 Physician: Narda Amber, DO  Sex: Female Height: 5\' 1"  Ref Phys: Narda Amber  ID#: [id number] Temp: 33.6C Technician:    Patient Complaints: This is a 63 year-old female presenting for evaluation of difficulty swallowing, talking, and shortness of breath.  NCV & EMG Findings: Extensive electrodiagnostic testing of the right upper extremity and lower extremity with additional repetitive nerve stimulation of the spinal accessory, median, and peroneal nerves shows the following: 1. Median sensory response shows prolonged distal latency (5.13ms). Sural sensory responses within normal limits.  2. Median motor nerve showed prolonged distal latency (5.5 ms). The peroneal motor nerve is within normal limits. 3. Repetitive nerve stimulation of the median nerve recording at abductor pollicis brevis shows 49% percent decrement. Repetitive nerve stimulation of the peroneal and spinal accessory nerves also demonstrate borderline decrement of CMAP. 4. Needle electrode examination shows no active or chronic motor axon loss changes involving any of the tested muscles.   Impression: 1. 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 2. Right median neuropathy, at or distal to the wrist, consistent with the clinical diagnosis of carpal tunnel syndrome. Overall, these findings are mild to moderate in degree electrically.   ___________________________ Narda Amber, DO    Nerve Conduction Studies Anti Sensory Summary Table   Site NR Peak (ms) Norm Peak (ms) P-T Amp (V) Norm P-T Amp  Right Median Anti Sensory (2nd Digit)  Wrist    5.2 <3.8 15.0 >10  Right Sural Anti Sensory (Lat Mall)  Calf    3.6 <4.6 9.6 >3   Motor Summary Table   Site NR Onset (ms) Norm  Onset (ms) O-P Amp (mV) Norm O-P Amp Site1 Site2 Delta-0 (ms) Dist (cm) Vel (m/s) Norm Vel (m/s)  Right Median Motor (Abd Poll Brev)  Wrist    5.5 <4.0 8.3 >5 Elbow Wrist 4.9 25.0 51 >50  Elbow    10.4  7.8         Right Peroneal Motor (Ext Dig Brev)  Ankle    4.5 <6.0 5.7 >2.5 B Fib Ankle 6.8 30.0 44 >40  B Fib    11.3  5.2  Poplt B Fib 1.4 9.0 64 >40  Poplt    12.7  5.2         Right Peroneal TA Motor (Tib Ant)  Fib Head    2.6 <4.5 6.8 >3 Poplit Fib Head 1.2 9.0 75 >40  Poplit    3.8  6.7          EMG   Side Muscle Ins Act Fibs Psw Fasc Number Recrt Dur Dur. Amp Amp. Poly Poly. Comment  Right 1stDorInt Nml Nml Nml Nml Nml Nml Nml Nml Nml Nml Nml Nml N/A  Right FlexPolLong Nml Nml Nml Nml Nml Nml Nml Nml Nml Nml Nml Nml N/A  Right Abd Poll Brev Nml Nml Nml Nml Nml Nml Nml Nml Nml Nml Nml Nml N/A  Right PronatorTeres Nml Nml Nml Nml Nml Nml Nml Nml Nml Nml Nml Nml N/A  Right Biceps Nml Nml Nml Nml Nml Nml Nml Nml Nml Nml Nml Nml N/A  Right Triceps Nml Nml Nml Nml Nml Nml Nml Nml Nml Nml Nml Nml N/A  Right Deltoid Nml Nml Nml Nml Nml Nml Nml Nml Nml Nml Nml Nml N/A  Right AntTibialis Nml  Nml Nml Nml Nml Nml Nml Nml Nml Nml Nml Nml N/A  Right Gastroc Nml Nml Nml Nml Nml Nml Nml Nml Nml Nml Nml Nml N/A  Right RectFemoris Nml Nml Nml Nml Nml Nml Nml Nml Nml Nml Nml Nml N/A  Right GluteusMed Nml Nml Nml Nml Nml Nml Nml Nml Nml Nml Nml Nml N/A  Right BicepsFemS Nml Nml Nml Nml Nml Nml Nml Nml Nml Nml Nml Nml N/A   RNS   Trial # Label Amp 1 (mV)  O-P Amp 5 (mV)  O-P Amp % Dif Area 1 (mVms) Area 5 (mVms) Area % Dif Rep Rate Train Length Pause Time (min:sec) Comments  Right AntTibialis  Tr 1 Baseline 6.56 6.46 -1.6 42.93 40.99 -4.5 3.00 10 00:30   Tr 2 Post Exercise 7.07 6.38 -9.8 42.34 39.30 -7.2 3.00 10 01:00   Tr 3 1 Min Post 7.01 6.43 -8.4 47.77 41.98 -12.1 3.00 10 01:00   Tr 4 2 Min Post 6.58 6.14 -6.7 43.38 39.05 -10.0 3.00 10 01:00   Tr 5 3 Min Post 6.61 6.28 -4.9 43.48 39.44  -9.3 3.00 10 00:00   Right Abd Poll Brev  Tr 1 Baseline 7.91 6.65 -15.9 27.93 21.92 -21.5 3.00 10 00:30   Tr 2 Post Exercise 8.45 7.49 -11.4 28.77 24.15 -16.1 3.00 10 01:00   Tr 3 1 Min Post 8.19 6.73 -17.8 28.99 22.49 -22.4 3.00 10 01:00   Tr 4 2 Min Post 8.36 6.72 -19.6 28.80 21.90 -23.9 3.00 10 01:00   Tr 5 3 Min Post 8.34 6.73 -19.4 28.53 22.04 -22.7 3.00 10 00:00   Right Trapezius  Tr 1 Baseline 7.02 7.54 7.4 41.79 41.47 -0.8 3.00 10 00:30   Tr 2 Post Exercise 7.99 8.22 2.8 47.67 45.14 -5.3 3.00 10 01:00   Tr 3 1 Min Post 8.58 8.53 -0.6 51.74 45.31 -12.4 3.00 10 01:00   Tr 4 2 Min Post 8.76 8.54 -2.5 52.58 45.79 -12.9 3.00 10 01:00   Tr 5 3 Min Post 8.91 8.52 -4.4 53.84 45.19 -16.1 3.00 10 00:00              Waveforms:

## 2013-10-24 ENCOUNTER — Ambulatory Visit (INDEPENDENT_AMBULATORY_CARE_PROVIDER_SITE_OTHER): Payer: 59 | Admitting: Neurology

## 2013-10-24 ENCOUNTER — Encounter: Payer: Self-pay | Admitting: Neurology

## 2013-10-24 VITALS — BP 134/74 | HR 75 | Ht 61.0 in | Wt 139.2 lb

## 2013-10-24 DIAGNOSIS — R05 Cough: Secondary | ICD-10-CM

## 2013-10-24 DIAGNOSIS — R0602 Shortness of breath: Secondary | ICD-10-CM

## 2013-10-24 DIAGNOSIS — G7001 Myasthenia gravis with (acute) exacerbation: Secondary | ICD-10-CM

## 2013-10-24 DIAGNOSIS — R059 Cough, unspecified: Secondary | ICD-10-CM

## 2013-10-24 MED ORDER — PYRIDOSTIGMINE BROMIDE 60 MG PO TABS: 60.0000 mg | ORAL_TABLET | Freq: Three times a day (TID) | ORAL | Status: DC

## 2013-10-24 MED ORDER — PREDNISONE 10 MG PO TABS: 10.0000 mg | ORAL_TABLET | Freq: Every day | ORAL | Status: DC

## 2013-10-24 NOTE — Patient Instructions (Addendum)
1.  Start prednisone 10mg  daily 2.  Start mestinon 60mg  three times daily. 3.  While on prednisone, you will need to have your blood sugars closely monitored.  Please share with your primary care provider that you will be on prednisone so medications can be adjusted accordingly. 4.  Continue taking nexium and calcium supplements 5.  CT chest wwo contrast 6.  Return to clinic in 2 weeks   Drugs that impair neuromuscular transmission and may increase weakness in patients with underlying neuromuscular junction disorders like myasthenia gravis  Antibiotics Aminoglycosides tobramycin gentamicin netilmicin neomycin streptomycin kanamycin Fluoroquinolones ciprofloxacin norfloxacin ofloxacin Ketolides telithromycin (Ketek) Other antibiotics tetracyclines sulfonamides penicillins amino acid antibiotics macrolides azithromycin clarithromycin ritonavir nitrofurantoin Quinolones quinidine quinine chloroquine fluoquinolone antibiotics   Table 2. Drugs implicated as potentially harmful in myasthenia gravis patients based on either anecdotal case reports or in-vitro microelectrode studies (or both) Beta blockers propranolol oxprenolol timolol practolol atenolol labetalol metoprolol nadolol Calcium channel blockers verapamil Other cardiac drugs procainamide bretylium trimethaphan Anticonvulsant medication phenytoin barbiturates ethosuximide carbamazepine gabapentin Ophthalmologic medications timolol betaxolol hydrochloride. echothiophate (a long-acting cholinesterase inhibitor used in the treatment of open angle glaucoma) Psychiatric drugs lithium carbonate phenothiazines amitriptyline imipramine amphetamines haloperidol Other drugs prescribed by neurologists riluzole glatiramer acetate Miscellaneous Drugs fludarabine cisplatin interleukin-2

## 2013-10-24 NOTE — Progress Notes (Signed)
Note faxed.

## 2013-10-24 NOTE — Progress Notes (Signed)
Follow-up Visit   Date: 10/24/2013   Cassidy Carlson MRN: [id number] DOB: 20-Nov-1950   Interim History: Cassidy Carlson is a 63 y.o. right-handed Bengali female with history of diabetes mellitus (diagnosed 2014), hyperlipidemia, GERD and hypertension returning for follow-up of double vision, difficulty talking, and difficulty swallowing.  The patient was accompanied to the clinic by husband and daughter (pharmacist)who also provides collateral information.    History of present illness: Starting on 10/12/2013, she felt weakness of the tongue, double vision, difficulty keeping her eyes, and speech difficulty. She went to the emergency department where she was admitted overnight and had MRI brain which did not show acute stroke. Labs including CK and ACRh antibodies (binding, blocking, and modulating) were nondiagnostic and she was recommended to follow up with neurology for further evaluation.  She continues to feels that her speech is "tangled", has generalized fatigue, and complains of double vision, described images on top of each other. Tongue movements are slowed, feels as if something inside through. Of late, she has started to sleep on 4 pillows because laying flat makes her short of breath.  She has no weakness of her arms and legs, but does describe generalized fatigue.  UPDATE 10/24/2013:  Patient is here to discuss results of EMG which shows post-synaptic NMJ disorder consistent with MG.  She continues to have difficulty swallowing, talking, and complains of double vision.  Shortness of breath is stable and she continues to sleep in 2 pillows. No significant worsening of symptoms in the past few days.      Medications:  Current Outpatient Prescriptions on File Prior to Visit  Medication Sig Dispense Refill  . aspirin 81 MG tablet Take 1 tablet (81 mg total) by mouth daily.  60 tablet  2  . escitalopram (LEXAPRO) 5 MG tablet       . esomeprazole (NEXIUM) 40 MG capsule Take 40 mg by  mouth daily at 12 noon.      . metFORMIN (GLUCOPHAGE) 500 MG tablet       . metoprolol succinate (TOPROL-XL) 50 MG 24 hr tablet Take 50 mg by mouth daily. Take with or immediately following a meal.      . Olmesartan-Amlodipine-HCTZ (TRIBENZOR) 20-5-12.5 MG TABS Take 1 tablet by mouth daily.       . ranitidine (ZANTAC) 75 MG tablet Take 150 mg by mouth daily as needed for heartburn.      . rosuvastatin (CRESTOR) 10 MG tablet Take 10 mg by mouth daily.      . sitaGLIPtin (JANUVIA) 100 MG tablet Take 100 mg by mouth daily.      . THIAMINE HCL PO Take 1 tablet by mouth daily.       No current facility-administered medications on file prior to visit.    Allergies:  Allergies  Allergen Reactions  . Sulfa Antibiotics Rash  . Pork-Derived Products      Review of Systems:  CONSTITUTIONAL: No fevers, chills, night sweats, or weight loss.   EYES: +visual changes or eye pain ENT: No hearing changes.  No history of nose bleeds.   RESPIRATORY: No cough, wheezing +shortness of breath.   CARDIOVASCULAR: Negative for chest pain, and palpitations.   GI: Negative for abdominal discomfort, blood in stools or black stools.  No recent change in bowel habits.   GU:  No history of incontinence.   MUSCLOSKELETAL: No history of joint pain or swelling.  No myalgias.   SKIN: Negative for lesions, rash, and itching.   ENDOCRINE: Negative  for cold or heat intolerance, polydipsia or goiter.   PSYCH:  No depression or anxiety symptoms.   NEURO: As Above.   Vital Signs:  BP 134/74  Pulse 75  Ht 5\' 1"  (1.549 m)  Wt 139 lb 4 oz (63.163 kg)  BMI 26.32 kg/m2  SpO2 97%  Gen:  Tired-appearing, comfortable Pulm:  Clear to auscultation, non-labored breathing, She is able to count to 22 on deep inhalation   Neurological Exam: MENTAL STATUS including orientation to time, place, person, recent and remote memory, attention span and concentration, language, and fund of knowledge is normal.  Speech is moderately  nasal in quality and slow.   CRANIAL NERVES:   Pupils equal round and reactive to light.  Eyes are conjugate however there is evidence of mild ophthalmoplegia especially with lateral gaze and upgaze. No nystagmus. Moderate bilateral ptosis.  Orbicularis oculi is 4/5, orbicularis oris is 5/5. Mild-moderate weakness of buccinator muscles. Palate elevates symmetrically.  Tongue is midline. Moderate weakness of tongue strength 4/5, tongue movements are slowed  MOTOR:  Motor strength is 5/5 in all extremities, including neck flexion and extension.   MSRs:  Reflexes are 2+/4 throughout.  COORDINATION/GAIT:  Gait narrow based and stable.   Data: Myasthenia AChR binding, blocking, and modulating antibody 10/13/2013: Negative Lab Results  Component Value Date   TSH 0.710 10/22/2013   Lab Results  Component Value Date   CRP <0.5 10/22/2013    MRI brain 10/13/2013: Mild chronic microvascular ischemic change. No acute abnormality.   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 Right median neuropathy, at or distal to the wrist, consistent with the clinical diagnosis of carpal tunnel syndrome. Overall, these findings are mild to moderate in degree electrically.   IMPRESSION: 1.  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  Neck flexion and limb strength intact.   - Based on exam, I do not feel patient is at risk for respiratory compromise so will manage her in out-patient setting   I had an extensive discussion with the patient regarding the diagnosis, pathogenesis, prognosis, management plan, meds to avoid, and potential crisis myasthenia gravis. I also discussed the warning signs  and symptoms of MG crisis (including significant swallowing and breathing difficulty) that should prompt urgent medical evaluation since an exacerbation of myasthenia gravis is potentially life-threatening. He was also provided with a list of medications that can worsen MG and that he should avoid.   2.  Diabetes mellitus 3.  Hyperlipidemia 4.  Hypertension 5.  GERD   PLAN/RECOMMENDATIONS:  1.  Start prednisone 10mg  daily.  Risks and benefits discussed 2.  Start mestinon 60mg  three times daily. 3.  While on prednisone, patient will need to have blood sugars closely monitored, especially since she is diabetic.  I will request PCP also follows her blood sugar as her medication may need to be adjusted, especially if she requires high doses of prednisone 4.  Continue taking nexium and calcium supplements 5.  CT chest wwo contrast to look for thymoma 6.  Return to clinic in 2 weeks   The duration of this appointment visit was 45 minutes of face-to-face time with the patient.  Greater than 50% of this time was spent in counseling, explanation of diagnosis, planning of further  management, and coordination of care.   Thank you for allowing me to participate in patient's care.  If I can answer any additional questions, I would be pleased to do so.    Sincerely,    Raynah Gomes K. Posey Pronto, DO

## 2013-10-30 ENCOUNTER — Encounter (HOSPITAL_COMMUNITY): Payer: Self-pay

## 2013-10-30 ENCOUNTER — Ambulatory Visit (HOSPITAL_COMMUNITY)
Admission: RE | Admit: 2013-10-30 | Discharge: 2013-10-30 | Disposition: A | Discharge status: Home / Self Care | Payer: 59 | Source: Ambulatory Visit | Attending: Neurology | Admitting: Neurology

## 2013-10-30 DIAGNOSIS — R05 Cough: Secondary | ICD-10-CM | POA: Diagnosis not present

## 2013-10-30 DIAGNOSIS — G7001 Myasthenia gravis with (acute) exacerbation: Secondary | ICD-10-CM | POA: Diagnosis not present

## 2013-10-30 DIAGNOSIS — K7689 Other specified diseases of liver: Secondary | ICD-10-CM | POA: Diagnosis not present

## 2013-10-30 DIAGNOSIS — R0602 Shortness of breath: Secondary | ICD-10-CM | POA: Insufficient documentation

## 2013-10-30 DIAGNOSIS — R059 Cough, unspecified: Secondary | ICD-10-CM

## 2013-10-30 MED ORDER — IOHEXOL 300 MG/ML  SOLN
75.0000 mL | Freq: Once | INTRAMUSCULAR | Status: AC | PRN
  Administered 2013-10-30: 75 mL via INTRAVENOUS

## 2013-10-31 ENCOUNTER — Ambulatory Visit: Payer: 59 | Admitting: Neurology

## 2013-11-04 ENCOUNTER — Ambulatory Visit (INDEPENDENT_AMBULATORY_CARE_PROVIDER_SITE_OTHER): Payer: 59 | Admitting: Neurology

## 2013-11-04 ENCOUNTER — Encounter: Payer: Self-pay | Admitting: Neurology

## 2013-11-04 VITALS — BP 134/70 | HR 61 | Ht 61.0 in | Wt 142.0 lb

## 2013-11-04 DIAGNOSIS — G7000 Myasthenia gravis without (acute) exacerbation: Secondary | ICD-10-CM

## 2013-11-04 DIAGNOSIS — G7001 Myasthenia gravis with (acute) exacerbation: Secondary | ICD-10-CM

## 2013-11-04 HISTORY — DX: Myasthenia gravis without (acute) exacerbation: G70.00

## 2013-11-04 NOTE — Patient Instructions (Addendum)
1.  Continue prednisone 10mg  daily and mestinon 60mg  three times daily 2.  Call me in one week with an update, if you are not improving, we will plan to increase prednisone 3.  Continue to take calcium and nexium 4.  Return to clinic in 4 weeks

## 2013-11-04 NOTE — Progress Notes (Signed)
Follow-up Visit   Date: 11/04/2013   Cassidy Carlson MRN: [id number] DOB: 07/31/1950   Interim History: Cassidy Carlson is a 63 y.o. right-handed Bengali female with history of diabetes mellitus (diagnosed 2014), hyperlipidemia, GERD and hypertension returning for follow-up of double vision, difficulty talking, and difficulty swallowing.  The patient was accompanied to the clinic by husband who also provides collateral information.    History of present illness: Starting on 10/12/2013, she felt weakness of the tongue, double vision, difficulty keeping her eyes, and speech difficulty. She went to the emergency department where she was admitted overnight and had MRI brain which did not show acute stroke. Labs including CK and ACRh antibodies (binding, blocking, and modulating) were nondiagnostic and she was recommended to follow up with neurology for further evaluation.  She continues to feels that her speech is "tangled", has generalized fatigue, and complains of double vision, described images on top of each other. Tongue movements are slowed, feels as if something inside through. Of late, she has started to sleep on 4 pillows because laying flat makes her short of breath.  She has no weakness of her arms and legs, but does describe generalized fatigue.  UPDATE 10/24/2013:  Patient is here to discuss results of EMG which shows post-synaptic NMJ disorder consistent with MG.  She continues to have difficulty swallowing, talking, and complains of double vision.  Shortness of breath is stable and she continues to sleep in 2 pillows. No significant worsening of symptoms in the past few days.    UPDATE 11/04/2013:  She started mestinon 60mg  and noticed improvement of her voice and vision.  Denies any shortness of breath and is now sleeping on one pillow.  She continues to have difficulty swallowing larger pills and water.  She had no more double vision, but endorses blurry visions and problems with depth  perception.  She had spells of neck weakness especially after working a lot.     Medications:  Current Outpatient Prescriptions on File Prior to Visit  Medication Sig Dispense Refill  . aspirin 81 MG tablet Take 1 tablet (81 mg total) by mouth daily.  60 tablet  2  . escitalopram (LEXAPRO) 5 MG tablet       . esomeprazole (NEXIUM) 40 MG capsule Take 40 mg by mouth daily at 12 noon.      . metFORMIN (GLUCOPHAGE) 500 MG tablet       . metoprolol succinate (TOPROL-XL) 50 MG 24 hr tablet Take 50 mg by mouth daily. Take with or immediately following a meal.      . Olmesartan-Amlodipine-HCTZ (TRIBENZOR) 20-5-12.5 MG TABS Take 1 tablet by mouth daily.       . predniSONE (DELTASONE) 10 MG tablet Take 1 tablet (10 mg total) by mouth daily with breakfast.  30 tablet  3  . pyridostigmine (MESTINON) 60 MG tablet Take 1 tablet (60 mg total) by mouth 3 (three) times daily.  90 tablet  3  . ranitidine (ZANTAC) 75 MG tablet Take 150 mg by mouth daily as needed for heartburn.      . rosuvastatin (CRESTOR) 10 MG tablet Take 10 mg by mouth daily.      . sitaGLIPtin (JANUVIA) 100 MG tablet Take 100 mg by mouth daily.      . THIAMINE HCL PO Take 1 tablet by mouth daily.       No current facility-administered medications on file prior to visit.    Allergies:  Allergies  Allergen Reactions  . Sulfa  Antibiotics Rash  . Pork-Derived Products      Review of Systems:  CONSTITUTIONAL: No fevers, chills, night sweats, or weight loss.   EYES: +visual changes or eye pain ENT: No hearing changes.  No history of nose bleeds.   RESPIRATORY: No cough, wheezing +shortness of breath.   CARDIOVASCULAR: Negative for chest pain, and palpitations.   GI: Negative for abdominal discomfort, blood in stools or black stools.  No recent change in bowel habits.   GU:  No history of incontinence.   MUSCLOSKELETAL: No history of joint pain or swelling.  No myalgias.   SKIN: Negative for lesions, rash, and itching.     ENDOCRINE: Negative for cold or heat intolerance, polydipsia or goiter.   PSYCH:  No depression or anxiety symptoms.   NEURO: As Above.   Vital Signs:  BP 134/70  Pulse 61  Ht 5\' 1"  (1.549 m)  Wt 142 lb (64.411 kg)  BMI 26.84 kg/m2  SpO2 97%  Gen:  Wellappearing, comfortable Pulm:  Clear to auscultation, non-labored breathing  Neurological Exam: MENTAL STATUS including orientation to time, place, person, recent and remote memory, attention span and concentration, language, and fund of knowledge is normal.  Speech is not dysarthric. (improved)   CRANIAL NERVES:   Pupils equal round and reactive to light.  Eyes are conjugate however there is evidence of mild ophthalmoplegia especially with lateral gaze bilaterally. No nystagmus. No ptosis at rest or with sustained upward gaze (improved).  Orbicularis oculi is 5-/5, orbicularis oris is 5/5. Mild weakness of buccinator muscles. Palate elevates symmetrically.  Tongue is midline.  Mild weakness of tongue strength 5-/5, tongue movements are minimally slowed. (improved)  MOTOR:  Motor strength is 5/5 in all extremities, including neck flexion and extension.   MSRs:  Reflexes are 2+/4 throughout.  COORDINATION/GAIT:  Gait narrow based and stable.   Data: Myasthenia AChR binding, blocking, and modulating antibody 10/13/2013: Negative Lab Results  Component Value Date   TSH 0.710 10/22/2013   Lab Results  Component Value Date   CRP <0.5 10/22/2013    MRI brain 10/13/2013: Mild chronic microvascular ischemic change. No acute abnormality.   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 Right median neuropathy, at or distal to the wrist, consistent with the clinical diagnosis of carpal tunnel syndrome. Overall, these findings are mild to moderate in degree electrically.  CT chest 10/30/2013: No acute finding or finding to explain the patient's symptoms.  Diffuse  fatty infiltration of the liver. Subtle nodularity of the liver border is compatible with cirrhosis.  Calcific aortic and coronary atherosclerosis.    IMPRESSION: 1.  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  Diabetes mellitus 3.  Hyperlipidemia 4.  Hypertension 5.  GERD   PLAN/RECOMMENDATIONS:  1.  Continue prednisone 10mg  daily.   Patient has been instructed to call with update in 1 week so we can appreciate full effects of prednisone at 10mg , if she has not returned to baseline and continues to have motor deficits, will increase prednisone to 20mg   2.  Continue mestinon 60mg  three times daily. 3.  Continue to monitor blood sugars closely  4.  Continue taking nexium and calcium  supplements 5.  Return to clinic in 4 weeks   The duration of this appointment visit was 25 minutes of face-to-face time with the patient.  Greater than 50% of this time was spent in counseling, explanation of diagnosis, planning of further management, and coordination of care.   Thank you for allowing me to participate in patient's care.  If I can answer any additional questions, I would be pleased to do so.    Sincerely,    Giankarlo Leamer K. Posey Pronto, DO

## 2013-11-04 NOTE — Progress Notes (Signed)
Note faxed.

## 2013-11-07 ENCOUNTER — Telehealth: Payer: Self-pay | Admitting: *Deleted

## 2013-11-07 NOTE — Telephone Encounter (Signed)
Cassidy Carlson was given instructions and will call back with update.

## 2013-11-07 NOTE — Telephone Encounter (Signed)
Increase prednisone to 15mg  daily.  Tell them to call with update in 2-weeks. Thanks.  Donika K. Posey Pronto, DO

## 2013-11-07 NOTE — Telephone Encounter (Signed)
Patient's son called to let us know that patient has not had much improvement.  She is not breathing easy at night.  He said you mentioned increasing medication from 10 mg to 15 mg.  (prednisone?)  Please advise.

## 2013-12-09 ENCOUNTER — Encounter: Payer: Self-pay | Admitting: Neurology

## 2013-12-09 ENCOUNTER — Ambulatory Visit (INDEPENDENT_AMBULATORY_CARE_PROVIDER_SITE_OTHER): Payer: 59 | Admitting: Neurology

## 2013-12-09 VITALS — BP 140/84 | HR 66 | Ht 61.0 in | Wt 146.6 lb

## 2013-12-09 DIAGNOSIS — G7001 Myasthenia gravis with (acute) exacerbation: Secondary | ICD-10-CM

## 2013-12-09 DIAGNOSIS — R252 Cramp and spasm: Secondary | ICD-10-CM

## 2013-12-09 DIAGNOSIS — Z79899 Other long term (current) drug therapy: Secondary | ICD-10-CM

## 2013-12-09 MED ORDER — PREDNISONE 10 MG PO TABS: 10.0000 mg | ORAL_TABLET | Freq: Every day | ORAL | Status: DC

## 2013-12-09 MED ORDER — PYRIDOSTIGMINE BROMIDE 60 MG PO TABS: ORAL_TABLET | ORAL | Status: DC

## 2013-12-09 NOTE — Patient Instructions (Addendum)
1.  Continue prednisone 10mg  daily.  2.  Reduce mestinon to 60mg  to twice daily (10am and 5pm).  If you develop worsening of symptoms, go back to taking three times daily 3.  OK to return to work, as toleratable 4.  Return to clinic in 2-3 months

## 2013-12-09 NOTE — Progress Notes (Signed)
Note faxed.

## 2013-12-09 NOTE — Progress Notes (Signed)
Follow-up Visit   Date: 12/09/2013   Cassidy Carlson MRN: [id number] DOB: [birth year]   Interim History: Cassidy Carlson is a 63 y.o. 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 The patient was accompanied to the clinic by husband who also provides collateral information.    History of present illness: Starting on 10/12/2013, she felt weakness of the tongue, double vision, difficulty keeping her eyes, and speech difficulty. She went to the emergency department where she was admitted overnight and had MRI brain which did not show acute stroke. Labs including CK and ACRh antibodies (binding, blocking, and modulating) were nondiagnostic and she was recommended to follow up with neurology for further evaluation.  She continues to feels that her speech is "tangled", has generalized fatigue, and complains of double vision, described images on top of each other. Tongue movements are slowed, feels as if something inside through. Of late, she has started to sleep on 4 pillows because laying flat makes her short of breath.  She has no weakness of her arms and legs, but does describe generalized fatigue.  UPDATE 10/24/2013:  Patient is here to discuss results of EMG which shows post-synaptic NMJ disorder consistent with MG.  She continues to have difficulty swallowing, talking, and complains of double vision.  Shortness of breath is stable and she continues to sleep in 2 pillows. No significant worsening of symptoms in the past few days.    UPDATE 11/04/2013:  She started mestinon 60mg  and noticed improvement of her voice and vision.  Denies any shortness of breath and is now sleeping on one pillow.  She continues to have difficulty swallowing larger pills and water.  She had no more double vision, but endorses blurry visions and problems with depth perception.  She had spells of  neck weakness especially after working a lot.    UPDATE 12/09/2013:  Because of mild dysphagia, prednisone was increased to 15mg  daily but due to GERD she only did this for 10 days and self-tapered back to 10mg  daily. She has not noticed any worsening of MG symptoms and denies double vision, swallowing problems, or slurred speech.   She does complain of toe cramping, especially at night.   Medications:  Current Outpatient Prescriptions on File Prior to Visit  Medication Sig Dispense Refill  . aspirin 81 MG tablet Take 1 tablet (81 mg total) by mouth daily.  60 tablet  2  . escitalopram (LEXAPRO) 5 MG tablet       . esomeprazole (NEXIUM) 40 MG capsule Take 40 mg by mouth daily at 12 noon.      . metFORMIN (GLUCOPHAGE) 500 MG tablet       . metoprolol succinate (TOPROL-XL) 50 MG 24 hr tablet Take 50 mg by mouth daily. Take with or immediately following a meal.      . Olmesartan-Amlodipine-HCTZ (TRIBENZOR) 20-5-12.5 MG TABS Take 1 tablet by mouth daily.       . ranitidine (ZANTAC) 75 MG tablet Take 150 mg by mouth daily as needed for heartburn.      . rosuvastatin (CRESTOR) 10 MG tablet Take 10 mg by mouth daily.      . sitaGLIPtin (JANUVIA) 100 MG tablet Take 100 mg by mouth daily.      . THIAMINE HCL PO Take 1 tablet by mouth daily.       No current facility-administered medications on file prior to visit.    Allergies:  Allergies  Allergen Reactions  . Sulfa Antibiotics Rash  . Pork-Derived Products      Review of Systems:  CONSTITUTIONAL: No fevers, chills, night sweats, or weight loss.   EYES: +visual changes or eye pain ENT: No hearing changes.  No history of nose bleeds.   RESPIRATORY: No cough, wheezing +shortness of breath.   CARDIOVASCULAR: Negative for chest pain, and palpitations.   GI: Negative for abdominal discomfort, blood in stools or black stools.  No recent change in bowel habits.   GU:  No history of incontinence.   MUSCLOSKELETAL: No history of joint pain or  swelling.  No myalgias.   SKIN: Negative for lesions, rash, and itching.   ENDOCRINE: Negative for cold or heat intolerance, polydipsia or goiter.   PSYCH:  No depression or anxiety symptoms.   NEURO: As Above.   Vital Signs:  BP 140/84  Pulse 66  Ht 5\' 1"  (1.549 m)  Wt 146 lb 9 oz (66.48 kg)  BMI 27.71 kg/m2  SpO2 97%  Neurological Exam: MENTAL STATUS including orientation to time, place, person, recent and remote memory, attention span and concentration, language, and fund of knowledge is normal.  Speech is not dysarthric.  CRANIAL NERVES:   Pupils equal round and reactive to light.  Eyes are conjugate however there is evidence of mild ophthalmoplegia especially with lateral gaze bilaterally. No nystagmus. No ptosis at rest or with sustained upward gaze (improved).  Orbicularis oculi is 5/5 (improved), orbicularis oris is 5/5. Mild weakness of buccinator muscles. Palate elevates symmetrically.  Tongue is midline and motor strength is full (improved)  MOTOR:  Motor strength is 5/5 in all extremities, including neck flexion and extension.    COORDINATION/GAIT:  Gait narrow based and stable.   Data: Myasthenia AChR binding, blocking, and modulating antibody 10/13/2013: Negative Lab Results  Component Value Date   TSH 0.710 10/22/2013   Lab Results  Component Value Date   CRP <0.5 10/22/2013    MRI brain 10/13/2013: Mild chronic microvascular ischemic change. No acute abnormality.   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 Right median neuropathy, at or distal to the wrist, consistent with the clinical diagnosis of carpal tunnel syndrome. Overall, these findings are mild to moderate in degree electrically.  CT chest 10/30/2013: No acute finding or finding to explain the patient's symptoms.  Diffuse fatty infiltration of the liver. Subtle nodularity of the liver border is compatible with cirrhosis.    Calcific aortic and coronary atherosclerosis.    IMPRESSION: 1.  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  Muscle cramps, likely medication effect from mestinon  - Will taper mestinon 60mg  twice daily  2.  Diabetes mellitus 3.  Hyperlipidemia 4.  Hypertension 5.  GERD   PLAN/RECOMMENDATIONS:  1.  Continue prednisone 10mg  daily.  If she remains stable, will taper to 9mg  at next visit. 2.  Reduce mestinon to 60mg  to twice daily (10am and 5pm).  If you develop worsening of symptoms, go back to taking three times daily 3.  Continue to monitor blood sugars  4.  Continue taking nexium and calcium supplements 5.  OK to return to work, as toleratable 6.  Return to clinic in 2-3 months   The duration of this appointment visit was 25  minutes of face-to-face time with the patient.  Greater than 50% of this time was spent in counseling, explanation of diagnosis, planning of further management, and coordination of care.   Thank you for allowing me to participate in patient's care.  If I can answer any additional questions, I would be pleased to do so.    Sincerely,    Kileen Lange K. Posey Pronto, DO

## 2013-12-11 ENCOUNTER — Other Ambulatory Visit: Payer: Self-pay

## 2013-12-11 DIAGNOSIS — Z1231 Encounter for screening mammogram for malignant neoplasm of breast: Secondary | ICD-10-CM

## 2013-12-24 ENCOUNTER — Ambulatory Visit
Admission: RE | Admit: 2013-12-24 | Discharge: 2013-12-24 | Disposition: A | Discharge status: Home / Self Care | Payer: 59 | Source: Ambulatory Visit

## 2013-12-24 DIAGNOSIS — Z1231 Encounter for screening mammogram for malignant neoplasm of breast: Secondary | ICD-10-CM

## 2013-12-27 ENCOUNTER — Telehealth: Payer: Self-pay | Admitting: Neurology

## 2013-12-27 NOTE — Telephone Encounter (Signed)
OK to send letter with no restrictions. Please fax my last note to PCP.

## 2013-12-27 NOTE — Telephone Encounter (Signed)
Patient's husband said that his wife's job does not have any light duties.  He was wondering if you would send her back with no restrictions.  He also asked if I would fax a copy of your note to Dr. Shanda Bumps.  I will call him when this is done.

## 2013-12-27 NOTE — Telephone Encounter (Signed)
Deatra Canter, pt's spouse called requesting to speak to a nurse regarding pt going back to work. Please call pt # [phone]

## 2013-12-30 ENCOUNTER — Encounter: Payer: Self-pay | Admitting: *Deleted

## 2013-12-30 NOTE — Telephone Encounter (Signed)
Mr. Regner notified that letter is ready to be picked up.

## 2014-02-26 ENCOUNTER — Encounter: Payer: Self-pay | Admitting: Neurology

## 2014-02-26 ENCOUNTER — Ambulatory Visit (INDEPENDENT_AMBULATORY_CARE_PROVIDER_SITE_OTHER): Payer: 59 | Admitting: Neurology

## 2014-02-26 VITALS — BP 130/70 | HR 61 | Ht 61.0 in | Wt 150.1 lb

## 2014-02-26 DIAGNOSIS — H539 Unspecified visual disturbance: Secondary | ICD-10-CM

## 2014-02-26 DIAGNOSIS — G7000 Myasthenia gravis without (acute) exacerbation: Secondary | ICD-10-CM

## 2014-02-26 NOTE — Patient Instructions (Addendum)
1.  Continue prednisone 10mg  daily 2.  Continue mestinon 60mg  twice daily 3.  If vision continues to be blurry, follow-up with your eye doctor.  This does not seem to be related to myasthenia gravis. 4.  Return to clinic in 59-months

## 2014-02-26 NOTE — Progress Notes (Signed)
Follow-up Visit   Date: 02/26/2014   Cassidy Carlson MRN: [id number] DOB: Apr 10, 1950   Interim History: Cassidy Carlson is a 64 y.o. 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   History of present illness: Starting on 10/12/2013, she felt weakness of the tongue, double vision, difficulty keeping her eyes, and speech difficulty. She went to the emergency department where she was admitted overnight and had MRI brain which did not show acute stroke. Labs including CK and ACRh antibodies (binding, blocking, and modulating) were nondiagnostic and she was recommended to follow up with neurology for further evaluation.  She continues to feels that her speech is "tangled", has generalized fatigue, and complains of double vision, described images on top of each other. Tongue movements are slowed, feels as if something inside through. Of late, she has started to sleep on 4 pillows because laying flat makes her short of breath.  She has no weakness of her arms and legs, but does describe generalized fatigue.  UPDATE 10/24/2013:  Patient is here to discuss results of EMG which shows post-synaptic NMJ disorder consistent with MG.  She continues to have difficulty swallowing, talking, and complains of double vision.  Shortness of breath is stable and she continues to sleep in 2 pillows. No significant worsening of symptoms in the past few days.    UPDATE 11/04/2013:  She started mestinon 60mg  and noticed improvement of her voice and vision.  Denies any shortness of breath and is now sleeping on one pillow.  She continues to have difficulty swallowing larger pills and water.  She had no more double vision, but endorses blurry visions and problems with depth perception.  She had spells of neck weakness especially after working a lot.    UPDATE 12/09/2013:  Because of mild dysphagia,  prednisone was increased to 15mg  daily but due to GERD she only did this for 10 days and self-tapered back to 10mg  daily. She has not noticed any worsening of MG symptoms and denies double vision, swallowing problems, or slurred speech.     UPDATE 02/26/2014:  She reports having "blurry vision" especially when looking at distant objects.  Denies diplopia.  She had her eyes checked in November and acuity was reportedly 20/20.  She also complains of watering of her eyes.  Abdominal cramps have resolved since reducing mestinon to twice daily.  She has no new complaints of dysphagia, dysarthria, or weakness.     Medications:  Current Outpatient Prescriptions on File Prior to Visit  Medication Sig Dispense Refill  . aspirin 81 MG tablet Take 1 tablet (81 mg total) by mouth daily. 60 tablet 2  . escitalopram (LEXAPRO) 5 MG tablet     . esomeprazole (NEXIUM) 40 MG capsule Take 40 mg by mouth daily at 12 noon.    . metFORMIN (GLUCOPHAGE) 500 MG tablet     . metoprolol succinate (TOPROL-XL) 50 MG 24 hr tablet Take 50 mg by mouth daily. Take with or immediately following a meal.    . Olmesartan-Amlodipine-HCTZ (TRIBENZOR) 20-5-12.5 MG TABS Take 1 tablet by mouth daily.     . predniSONE (DELTASONE) 10 MG tablet Take 1 tablet (10 mg total) by mouth daily with breakfast. 90 tablet 2  . pyridostigmine (MESTINON) 60 MG tablet One tablet twice daily (10am and 5pm). 180 tablet 2  . ranitidine (ZANTAC) 75 MG tablet Take 150 mg by mouth daily as needed for heartburn.    Marland Kitchen  rosuvastatin (CRESTOR) 10 MG tablet Take 10 mg by mouth daily.    . sitaGLIPtin (JANUVIA) 100 MG tablet Take 100 mg by mouth daily.    . THIAMINE HCL PO Take 1 tablet by mouth daily.     No current facility-administered medications on file prior to visit.    Allergies:  Allergies  Allergen Reactions  . Sulfa Antibiotics Rash  . Pork-Derived Products      Review of Systems:  CONSTITUTIONAL: No fevers, chills, night sweats, or weight  loss.   EYES: +visual changes or eye pain + tearing ENT: No hearing changes.  No history of nose bleeds.   RESPIRATORY: No cough, wheezing +shortness of breath.   CARDIOVASCULAR: Negative for chest pain, and palpitations.   GI: Negative for abdominal discomfort, blood in stools or black stools.  No recent change in bowel habits.   GU:  No history of incontinence.   MUSCLOSKELETAL: No history of joint pain or swelling.  No myalgias.   SKIN: Negative for lesions, rash, and itching.   ENDOCRINE: Negative for cold or heat intolerance, polydipsia or goiter.   PSYCH:  No depression or anxiety symptoms.   NEURO: As Above.   Vital Signs:  BP 130/70 mmHg  Pulse 61  Ht 5\' 1"  (1.549 m)  Wt 150 lb 1 oz (68.068 kg)  BMI 28.37 kg/m2  SpO2 95%  Neurological Exam: MENTAL STATUS including orientation to time, place, person, recent and remote memory, attention span and concentration, language, and fund of knowledge is normal.  Speech is not dysarthric.  CRANIAL NERVES:   Pupils equal round and reactive to light.  Eyes are conjugate with extraocular muscles intact. No nystagmus. No ptosis at rest or with sustained upward gaze.  Orbicularis oculi, orbicularis oris, and buccinator is 5/5 (improved). Palate elevates symmetrically.  Tongue is midline and motor strength is full.  MOTOR:  Motor strength is 5/5 in all extremities, including neck flexion and extension.   COORDINATION/GAIT:  Gait narrow based and stable.   Data: Myasthenia AChR binding, blocking, and modulating antibody 10/13/2013: Negative Lab Results  Component Value Date   TSH 0.710 10/22/2013   Lab Results  Component Value Date   CRP <0.5 10/22/2013    MRI brain 10/13/2013: Mild chronic microvascular ischemic change. No acute abnormality.   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 Right median neuropathy, at or distal to the wrist, consistent  with the clinical diagnosis of carpal tunnel syndrome. Overall, these findings are mild to moderate in degree electrically.  CT chest 10/30/2013: No acute finding or finding to explain the patient's symptoms.  Diffuse fatty infiltration of the liver. Subtle nodularity of the liver border is compatible with cirrhosis.  Calcific aortic and coronary atherosclerosis.    IMPRESSION: 1.  Generalized myasthenia gravis (10/23/2013)  - AChR antibodies are negative  - Presenting with fatigue, diplopia, dysarthria, and dysphagia in August 2015  - Clinically doing great, no weakness on exam  - Vision complaints are more consistent with changes in vision acuity.  No complaints of diplopia, so would not increased prednisone  2.  Muscle cramps, medication effect from mestinon - resolved 2.  Diabetes mellitus 3.  Hyperlipidemia 4.  Hypertension 5.  GERD   PLAN/RECOMMENDATIONS:  1.  Continue prednisone 10mg  daily.  If she remains stable, will taper to 9mg  at next visit. 2.  Reduce mestinon to 60mg  to twice daily (10am and 5pm).   3.  Continue to monitor blood  sugars  4.  Continue taking nexium and calcium supplements 5.  Encouraged to f/u with her eyecare provider if her vision complaints do not improve.  I do not feel that this is related to her MG 6.  Return to clinic in 66-months   The duration of this appointment visit was 25 minutes of face-to-face time with the patient.  Greater than 50% of this time was spent in counseling, explanation of diagnosis, planning of further management, and coordination of care.   Thank you for allowing me to participate in patient's care.  If I can answer any additional questions, I would be pleased to do so.    Sincerely,    Donika K. Posey Pronto, DO

## 2014-04-14 ENCOUNTER — Ambulatory Visit (INDEPENDENT_AMBULATORY_CARE_PROVIDER_SITE_OTHER): Payer: 59 | Admitting: Neurology

## 2014-04-14 ENCOUNTER — Encounter: Payer: Self-pay | Admitting: Neurology

## 2014-04-14 VITALS — BP 140/78 | HR 65 | Ht 61.2 in | Wt 150.4 lb

## 2014-04-14 DIAGNOSIS — R42 Dizziness and giddiness: Secondary | ICD-10-CM

## 2014-04-14 DIAGNOSIS — G7000 Myasthenia gravis without (acute) exacerbation: Secondary | ICD-10-CM

## 2014-04-14 NOTE — Patient Instructions (Addendum)
Start prednisone 15mg  daily (1.5 tablets) Continue mestinon 60mg  twice daily You need to start drinking more water Please follow-up Dr. Dagmar Hait if your cough does not improve in a week I will see you back in a month for follow-up

## 2014-04-14 NOTE — Progress Notes (Signed)
Note faxed.

## 2014-04-14 NOTE — Progress Notes (Signed)
Follow-up Visit   Date: 04/14/2014   Cassidy Carlson MRN: [id number] DOB: [birth year]   Interim History: Cassidy Carlson is a 64 y.o. right-handed Bengali female with history of diabetes mellitus (diagnosed 2014), hyperlipidemia, GERD and hypertension returning for follow-up of seronegative myasthenia gravis.   History of present illness: Starting on 10/12/2013, she felt weakness of the tongue, double vision, difficulty keeping her eyes, and speech difficulty. She went to the emergency department where she was admitted overnight and had MRI brain which did not show acute stroke. Labs including CK and ACRh antibodies (binding, blocking, and modulating) were nondiagnostic and she was recommended to follow up with neurology for further evaluation.  She continues to feels that her speech is "tangled", has generalized fatigue, and complains of double vision, described images on top of each other. Tongue movements are slowed, feels as if something inside through. Of late, she has started to sleep on 4 pillows because laying flat makes her short of breath.  She has no weakness of her arms and legs, but does describe generalized fatigue.  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  She continues to have difficulty swallowing, talking, and complains of double vision.  Shortness of breath is stable.  UPDATE 11/04/2013:  She started mestinon 60mg  and noticed improvement of her voice and vision.  Denies any shortness of breath and is now sleeping on one pillow.  She continues to have difficulty swallowing larger pills and water.  No more double vision, but endorses blurry visions and problems with depth perception.    UPDATE 12/09/2013:  Because of mild dysphagia, prednisone was increased to 15mg  daily but due to GERD she only did this for 10 days and self-tapered back to 10mg  daily. She has not noticed any worsening of MG symptoms and denies double vision, swallowing problems,  or slurred speech.     UPDATE 02/26/2014:  She reports having "blurry vision" especially when looking at distant objects.  She had her eyes checked in November and acuity was reportedly 20/20.  She also complains of watering of her eyes.  Abdominal cramps have resolved since reducing mestinon to twice daily.  She has no new complaints of dysphagia, dysarthria, or weakness.    UPDATE 04/14/2014:  She reports coming back from [place] in mid-February and developed a dry cough.  She also complains of itching in her throat, runny eyes.  She has noticed that swallowing is worse and she is having some choking spells.  No double vision, but she has noticed that the left eye is ptosis.  No problems with limb weakness. She has noticed problems with lightheadedness, especially with positional changes and admits to drinking less water because of her dysphagia.  She is currently taking prednisone 10mg  daily and mestinon 60mg  three times daily.    Medications:  Current Outpatient Prescriptions on File Prior to Visit  Medication Sig Dispense Refill  . aspirin 81 MG tablet Take 1 tablet (81 mg total) by mouth daily. 60 tablet 2  . escitalopram (LEXAPRO) 5 MG tablet     . esomeprazole (NEXIUM) 40 MG capsule Take 40 mg by mouth daily at 12 noon.    . metFORMIN (GLUCOPHAGE) 500 MG tablet     . metoprolol succinate (TOPROL-XL) 50 MG 24 hr tablet Take 50 mg by mouth daily. Take with or immediately following a meal.    . Olmesartan-Amlodipine-HCTZ (TRIBENZOR) 20-5-12.5 MG TABS Take 1 tablet by mouth daily.     . predniSONE (  DELTASONE) 10 MG tablet Take 1 tablet (10 mg total) by mouth daily with breakfast. 90 tablet 2  . pyridostigmine (MESTINON) 60 MG tablet One tablet twice daily (10am and 5pm). 180 tablet 2  . ranitidine (ZANTAC) 75 MG tablet Take 150 mg by mouth daily as needed for heartburn.    . rosuvastatin (CRESTOR) 10 MG tablet Take 10 mg by mouth daily.    . sitaGLIPtin (JANUVIA) 100 MG tablet Take 100 mg by  mouth daily.    . THIAMINE HCL PO Take 1 tablet by mouth daily.     No current facility-administered medications on file prior to visit.    Allergies:  Allergies  Allergen Reactions  . Sulfa Antibiotics Rash  . Pork-Derived Products      Review of Systems:  CONSTITUTIONAL: No fevers, chills, night sweats, or weight loss.   EYES: +visual changes or eye pain + tearing ENT: No hearing changes.  No history of nose bleeds.   RESPIRATORY: No cough, wheezing +shortness of breath.   CARDIOVASCULAR: Negative for chest pain, and palpitations.   GI: Negative for abdominal discomfort, blood in stools or black stools.  No recent change in bowel habits.   GU:  No history of incontinence.   MUSCLOSKELETAL: No history of joint pain or swelling.  No myalgias.   SKIN: Negative for lesions, rash, and itching.   ENDOCRINE: Negative for cold or heat intolerance, polydipsia or goiter.   PSYCH:  No depression or anxiety symptoms.   NEURO: As Above.   Vital Signs:  BP 140/78 mmHg  Pulse 65  Ht 5' 1.2" (1.554 m)  Wt 150 lb 6 oz (68.21 kg)  BMI 28.25 kg/m2  SpO2 95%  General:  Well-appearing Heart:  RRR Pulm:  Clear to auscultation, no wheezes or rhonchi Ext:  No edema  Neurological Exam: MENTAL STATUS including orientation to time, place, person, recent and remote memory, attention span and concentration, language, and fund of knowledge is normal.  Speech is not dysarthric.  CRANIAL NERVES:   Pupils equal round and reactive to light.  Mild opthalmoplegia OU especially with lateral and upward gaze, eyes are conjugate, mild bilateral ptosis (left > right).  Orbicularis oculi and orbicularis oris are 5/5.  Buccinator is 4+/5.  Tongue strength is 5/5.  MOTOR:  Motor strength is 5/5 in all extremities, including neck flexion and extension.   COORDINATION/GAIT:  Gait narrow based and stable.   Data: Myasthenia AChR binding, blocking, and modulating antibody 10/13/2013: Negative Lab Results    Component Value Date   TSH 0.710 10/22/2013   Lab Results  Component Value Date   CRP <0.5 10/22/2013    MRI brain 10/13/2013: Mild chronic microvascular ischemic change. No acute abnormality.   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 Right median neuropathy, at or distal to the wrist, consistent with the clinical diagnosis of carpal tunnel syndrome. Overall, these findings are mild to moderate in degree electrically.  CT chest 10/30/2013: No acute finding or finding to explain the patient's symptoms.  Diffuse fatty infiltration of the liver. Subtle nodularity of the liver border is compatible with cirrhosis.  Calcific aortic and coronary atherosclerosis.    IMPRESSION: 1.  Seronegative generalized myasthenia gravis (diagnosed 10/23/2013), worsening Presenting symptoms:  fatigue, diplopia, dysarthria, and dysphagia in August 2015 Clinically with mild worsening of ptosis and dysphagia over the past month during which time she travelled to [place].  She also complains of dry cough and rhinitis, suggesting  that symptoms may be exacerbated by underlying viral infection.  Because swallowing is an issue, I will increase prednisone to 15mg  daily with hopes of tapering at her next visit.  I offered to increase mestinon but she had previously not tolerated higher doses due to cramps Understandably, she is reluctant to take chronic steroids and if I am unable to wean her, steroid-sparing agent may need to be started.   Encouraged to start well-hydrated, add ice chips, or thickner to fluids as needed  2.  Viral syndrome with cough and rhinitis Most likely due to recent travels, encouraged conservative management, but if no improvement in 1-2 weeks, she should be evaluated by her PCP  3.  Excessive tearing, which I explain that this is unrelated to myasthenia.  2.  Muscle cramps, medication effect from mestinon -  resolved 2.  Diabetes mellitus 3.  Hyperlipidemia 4.  Hypertension 5.  GERD   PLAN/RECOMMENDATIONS:  1.  Increase prednisone 15mg  daily (1.5 tablets) 2.  Continue mestinon to 60mg  to twice daily (10am and 5pm).   3.  Continue to monitor blood sugars  4.  Continue taking nexium and calcium supplements 5.  Encouraged to f/u with her PCP if her cough does not improve within 1-2 weeks, as this may be contributing to her worsening MG symptoms 6.  Return to clinic in 54-months   The duration of this appointment visit was 25 minutes of face-to-face time with the patient.  Greater than 50% of this time was spent in counseling, explanation of diagnosis, planning of further management, and coordination of care.   Thank you for allowing me to participate in patient's care.  If I can answer any additional questions, I would be pleased to do so.    Sincerely,    Donika K. Posey Pronto, DO

## 2014-04-16 ENCOUNTER — Telehealth: Payer: Self-pay | Admitting: Neurology

## 2014-04-16 NOTE — Telephone Encounter (Signed)
Pt canceled her f/u for 05/30/14 and kept the 05/13/14.

## 2014-05-13 ENCOUNTER — Encounter: Payer: Self-pay | Admitting: Neurology

## 2014-05-13 ENCOUNTER — Ambulatory Visit (INDEPENDENT_AMBULATORY_CARE_PROVIDER_SITE_OTHER): Payer: 59 | Admitting: Neurology

## 2014-05-13 VITALS — BP 120/70 | HR 61 | Ht 61.0 in | Wt 152.1 lb

## 2014-05-13 DIAGNOSIS — Z79899 Other long term (current) drug therapy: Secondary | ICD-10-CM

## 2014-05-13 DIAGNOSIS — H539 Unspecified visual disturbance: Secondary | ICD-10-CM

## 2014-05-13 DIAGNOSIS — Z5181 Encounter for therapeutic drug level monitoring: Secondary | ICD-10-CM

## 2014-05-13 DIAGNOSIS — G7000 Myasthenia gravis without (acute) exacerbation: Secondary | ICD-10-CM

## 2014-05-13 LAB — COMPREHENSIVE METABOLIC PANEL
ALT: 21 U/L (ref 0–35)
AST: 17 U/L (ref 0–37)
Albumin: 4.3 g/dL (ref 3.5–5.2)
Alkaline Phosphatase: 58 U/L (ref 39–117)
BUN: 15 mg/dL (ref 6–23)
CALCIUM: 9.3 mg/dL (ref 8.4–10.5)
CO2: 34 mEq/L — ABNORMAL HIGH (ref 19–32)
CREATININE: 0.88 mg/dL (ref 0.50–1.10)
Chloride: 98 mEq/L (ref 96–112)
GLUCOSE: 112 mg/dL — AB (ref 70–99)
Potassium: 4.1 mEq/L (ref 3.5–5.3)
Sodium: 136 mEq/L (ref 135–145)
TOTAL PROTEIN: 6.7 g/dL (ref 6.0–8.3)
Total Bilirubin: 0.6 mg/dL (ref 0.2–1.2)

## 2014-05-13 LAB — CBC
HCT: 41 % (ref 36.0–46.0)
HEMOGLOBIN: 13.6 g/dL (ref 12.0–15.0)
MCH: 30.2 pg (ref 26.0–34.0)
MCHC: 33.2 g/dL (ref 30.0–36.0)
MCV: 91.1 fL (ref 78.0–100.0)
MPV: 10 fL (ref 8.6–12.4)
PLATELETS: 253 10*3/uL (ref 150–400)
RBC: 4.5 MIL/uL (ref 3.87–5.11)
RDW: 13.6 % (ref 11.5–15.5)
WBC: 9.6 10*3/uL (ref 4.0–10.5)

## 2014-05-13 NOTE — Progress Notes (Signed)
Follow-up Visit   Date: 05/13/2014   Cassidy Carlson MRN: [id number] DOB: 06-13-1950   Interim History: Cassidy Carlson is a 64 y.o. right-handed Bengali female with history of diabetes mellitus (diagnosed 2014), hyperlipidemia, GERD and hypertension returning for follow-up of seronegative myasthenia gravis.   History of present illness: Starting on 10/12/2013, she felt weakness of the tongue, double vision, difficulty keeping her eyes, and speech difficulty. She went to the emergency department where she was admitted overnight and had MRI brain which did not show acute stroke. Labs including CK and ACRh antibodies (binding, blocking, and modulating) were nondiagnostic and she was recommended to follow up with neurology for further evaluation.  She continues to feels that her speech is "tangled", has generalized fatigue, and complains of double vision, described images on top of each other. Tongue movements are slowed, feels as if something inside through. Of late, she has started to sleep on 4 pillows because laying flat makes her short of breath.  She has no weakness of her arms and legs, but does describe generalized fatigue.  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  She continues to have difficulty swallowing, talking, and complains of double vision.  Shortness of breath is stable.  UPDATE 11/04/2013:  She started mestinon 60mg  and noticed improvement of her voice and vision.  Denies any shortness of breath and is now sleeping on one pillow.  She continues to have difficulty swallowing larger pills and water.  No more double vision, but endorses blurry visions and problems with depth perception.    UPDATE 12/09/2013:  Because of mild dysphagia, prednisone was increased to 15mg  daily but due to GERD she only did this for 10 days and self-tapered back to 10mg  daily. She has not noticed any worsening of MG symptoms and denies double vision, swallowing problems,  or slurred speech.     UPDATE 02/26/2014:  She reports having "blurry vision" especially when looking at distant objects.  She had her eyes checked in November and acuity was reportedly 20/20.  She also complains of watering of her eyes.  Abdominal cramps have resolved since reducing mestinon to twice daily.  She has no new complaints of dysphagia, dysarthria, or weakness.    UPDATE 04/14/2014:  She reports coming back from [place] in mid-February and developed a dry cough.  She also complains of itching in her throat, runny eyes.  She has noticed that swallowing is worse and she is having some choking spells.  No double vision, but she has noticed that the left eye ptosis.  No problems with limb weakness. She has noticed problems with lightheadedness, especially with positional changes and admits to drinking less water because of her dysphagia.  She is currently taking prednisone 10mg  daily and mestinon 60mg  three times daily.   UPDATE 05/13/2014:  At her last visit, prednisone was increased to 15mg  daily and she is doing much better.  Swallowing and ptosis is improved.  She continues to have blurry vision and intermittent double vision.  She has gained about 6lb since October 2016.  She is concerned because she is not resting well at night and needs more sleep, which I reassured her is not related to myasthenia.   Medications:  Current Outpatient Prescriptions on File Prior to Visit  Medication Sig Dispense Refill  . aspirin 81 MG tablet Take 1 tablet (81 mg total) by mouth daily. 60 tablet 2  . escitalopram (LEXAPRO) 5 MG tablet     .  esomeprazole (NEXIUM) 40 MG capsule Take 40 mg by mouth daily at 12 noon.    . metFORMIN (GLUCOPHAGE) 500 MG tablet     . metoprolol succinate (TOPROL-XL) 50 MG 24 hr tablet Take 50 mg by mouth daily. Take with or immediately following a meal.    . predniSONE (DELTASONE) 10 MG tablet Take 1 tablet (10 mg total) by mouth daily with breakfast. 90 tablet 2  .  pyridostigmine (MESTINON) 60 MG tablet One tablet twice daily (10am and 5pm). 180 tablet 2  . ranitidine (ZANTAC) 75 MG tablet Take 150 mg by mouth daily as needed for heartburn.    . rosuvastatin (CRESTOR) 10 MG tablet Take 10 mg by mouth daily.    . sitaGLIPtin (JANUVIA) 100 MG tablet Take 100 mg by mouth daily.    . THIAMINE HCL PO Take 1 tablet by mouth daily.     No current facility-administered medications on file prior to visit.    Allergies:  Allergies  Allergen Reactions  . Sulfa Antibiotics Rash  . Pork-Derived Products      Review of Systems:  CONSTITUTIONAL: No fevers, chills, night sweats, or weight loss.   EYES: +visual changes or eye pain + tearing ENT: No hearing changes.  No history of nose bleeds.   RESPIRATORY: No cough, wheezing +shortness of breath.   CARDIOVASCULAR: Negative for chest pain, and palpitations.   GI: Negative for abdominal discomfort, blood in stools or black stools.  No recent change in bowel habits.   GU:  No history of incontinence.   MUSCLOSKELETAL: No history of joint pain or swelling.  No myalgias.   SKIN: Negative for lesions, rash, and itching.   ENDOCRINE: Negative for cold or heat intolerance, polydipsia or goiter.   PSYCH:  No depression or anxiety symptoms.   NEURO: As Above.   Vital Signs:  BP 120/70 mmHg  Pulse 61  Ht 5\' 1"  (1.549 m)  Wt 152 lb 1 oz (68.975 kg)  BMI 28.75 kg/m2  SpO2 97%  General:  Well-appearing, fullness of the face  Neurological Exam: MENTAL STATUS including orientation to time, place, person, recent and remote memory, attention span and concentration, language, and fund of knowledge is normal.  Speech is not dysarthric.  CRANIAL NERVES:   Pupils equal round and reactive to light.  Mild opthalmoplegia OU especially with lateral gaze, eyes are conjugate, mild bilateral ptosis (left).  Orbicularis oculi and orbicularis oris are 5/5.  Buccinator is 5/5.  Tongue strength is 5/5.  MOTOR:  Motor strength is  5/5 in all extremities, including neck flexion and extension.   COORDINATION/GAIT:  Gait narrow based and stable.   Data: Myasthenia AChR binding, blocking, and modulating antibody 10/13/2013: Negative Lab Results  Component Value Date   TSH 0.710 10/22/2013   Lab Results  Component Value Date   CRP <0.5 10/22/2013    MRI brain 10/13/2013: Mild chronic microvascular ischemic change. No acute abnormality.   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 Right median neuropathy, at or distal to the wrist, consistent with the clinical diagnosis of carpal tunnel syndrome. Overall, these findings are mild to moderate in degree electrically.  CT chest 10/30/2013: No acute finding or finding to explain the patient's symptoms.  Diffuse fatty infiltration of the liver. Subtle nodularity of the liver border is compatible with cirrhosis.  Calcific aortic and coronary atherosclerosis.    IMPRESSION: 1.  Seronegative generalized myasthenia gravis (diagnosed 10/23/2013),  Presenting symptoms:  fatigue, diplopia, dysarthria, and dysphagia in August 2015 Clinically stable but still bilateral lateral gaze opthalmoplegia Continue prednisone 15mg  for now, discussed doing alternative day dosing, but patient still not back to her baseline so will hold Given that she is showing cushingoid features, will likely need to start steroid-sparing agent.  We discussed Imuran and Cellcept as options, including risks and benefits.  Screening for TMPT will be checked  2.  Excessive tearing, which I explain that this is unrelated to myasthenia.  3.  Blurry vision, recommended eye evaluation 4.  Diabetes mellitus 5.  Hyperlipidemia 6.  Hypertension 7.  GERD   PLAN/RECOMMENDATIONS:  1.  Continue prednisone 15mg  daily (1.5 tablets), plan on starting steroid-sparing agent if unable to taper prednisone down to less than 10mg  2.  Continue  mestinon to 60mg  to twice daily (10am and 5pm).   3.  Check CBC, CMP, and TPMP 4.  Encouraged patient to stay active, exercise, and eat healthy 5.  Continue taking nexium and calcium supplements 6.  Return to clinic in 4-6 weeks   The duration of this appointment visit was 30 minutes of face-to-face time with the patient.  Greater than 50% of this time was spent in counseling, explanation of diagnosis, planning of further management, and coordination of care.   Thank you for allowing me to participate in patient's care.  If I can answer any additional questions, I would be pleased to do so.    Sincerely,    Donika K. Posey Pronto, DO

## 2014-05-13 NOTE — Patient Instructions (Addendum)
We may consider adding a steroid-sparing medication such as Imuran (azathioprine) or Cellcept (mycophenolate mofetil).  Please share this information with your daughter.  When you are on these medications, we will follow your blood count and liver enzymes with routine blood work. We will check blood work today Continue taking your medications as you are taking them Return to clinic in 4-6 weeks

## 2014-05-20 LAB — THIOPURINE METHYLTRANSFERASE (TPMT), RBC: Thiopurine Methyltransferase, RBC: 16 nmol/hr/mL RBC

## 2014-05-22 ENCOUNTER — Encounter: Payer: Self-pay | Admitting: *Deleted

## 2014-05-30 ENCOUNTER — Ambulatory Visit: Payer: 59 | Admitting: Neurology

## 2014-06-27 ENCOUNTER — Other Ambulatory Visit: Payer: Self-pay | Admitting: Neurology

## 2014-06-27 NOTE — Telephone Encounter (Signed)
Rx sent 

## 2014-07-24 ENCOUNTER — Ambulatory Visit (INDEPENDENT_AMBULATORY_CARE_PROVIDER_SITE_OTHER): Payer: 59 | Admitting: Neurology

## 2014-07-24 ENCOUNTER — Encounter: Payer: Self-pay | Admitting: Neurology

## 2014-07-24 VITALS — BP 130/74 | HR 75 | Ht 61.0 in | Wt 155.3 lb

## 2014-07-24 DIAGNOSIS — G7000 Myasthenia gravis without (acute) exacerbation: Secondary | ICD-10-CM

## 2014-07-24 DIAGNOSIS — Z5181 Encounter for therapeutic drug level monitoring: Secondary | ICD-10-CM | POA: Diagnosis not present

## 2014-07-24 DIAGNOSIS — Z79899 Other long term (current) drug therapy: Secondary | ICD-10-CM

## 2014-07-24 DIAGNOSIS — M79604 Pain in right leg: Secondary | ICD-10-CM

## 2014-07-24 MED ORDER — AZATHIOPRINE 50 MG PO TABS: ORAL_TABLET | ORAL | Status: DC

## 2014-07-24 MED ORDER — PYRIDOSTIGMINE BROMIDE 60 MG PO TABS: ORAL_TABLET | ORAL | Status: DC

## 2014-07-24 MED ORDER — PREDNISONE 10 MG PO TABS: ORAL_TABLET | ORAL | Status: DC

## 2014-07-24 NOTE — Progress Notes (Signed)
Note sent

## 2014-07-24 NOTE — Progress Notes (Signed)
Follow-up Visit   Date: 07/24/2014   Cassidy Carlson MRN: [id number] DOB: 1950-10-30   Interim History: Cassidy Carlson is a 64 y.o. right-handed Bengali female with history of diabetes mellitus (diagnosed 2014), hyperlipidemia, GERD and hypertension returning for follow-up of seronegative myasthenia gravis and new complains of right leg pain.   History of present illness: Starting on 10/12/2013, she felt weakness of the tongue, double vision, difficulty keeping her eyes, and speech difficulty. She went to the emergency department where she was admitted overnight and had MRI brain which did not show acute stroke. Labs including CK and ACRh antibodies (binding, blocking, and modulating) were nondiagnostic and she was recommended to follow up with neurology for further evaluation.  She continues to feels that her speech is "tangled", has generalized fatigue, and complains of double vision, described images on top of each other. Tongue movements are slowed, feels as if something inside through. Of late, she has started to sleep on 4 pillows because laying flat makes her short of breath.  She has no weakness of her arms and legs, but does describe generalized fatigue.  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  She continues to have difficulty swallowing, talking, and complains of double vision.  Shortness of breath is stable.  UPDATE 11/04/2013:  She started mestinon 60mg  and noticed improvement of her voice and vision.  Denies any shortness of breath and is now sleeping on one pillow.  She continues to have difficulty swallowing larger pills and water.  No more double vision, but endorses blurry visions and problems with depth perception.    UPDATE 12/09/2013:  Because of mild dysphagia, prednisone was increased to 15mg  daily but due to GERD she only did this for 10 days and self-tapered back to 10mg  daily. She has not noticed any worsening of MG symptoms and denies  double vision, swallowing problems, or slurred speech.     UPDATE 02/26/2014:  She reports having "blurry vision" especially when looking at distant objects.  She had her eyes checked in November and acuity was reportedly 20/20.  She also complains of watering of her eyes.  Abdominal cramps have resolved since reducing mestinon to twice daily.  She has no new complaints of dysphagia, dysarthria, or weakness.    UPDATE 04/14/2014:  She reports coming back from [place] in mid-February and developed a dry cough.  She also complains of itching in her throat, runny eyes.  She has noticed that swallowing is worse and she is having some choking spells.  No double vision, but she has noticed that the left eye ptosis.  No problems with limb weakness. She has noticed problems with lightheadedness, especially with positional changes and admits to drinking less water because of her dysphagia.  She is currently taking prednisone 10mg  daily and mestinon 60mg  three times daily.   UPDATE 05/13/2014:  At her last visit, prednisone was increased to 15mg  daily and she is doing much better.  Swallowing and ptosis is improved.  She continues to have blurry vision and intermittent double vision.  She has gained about 6lb since October 2016.  She is concerned because she is not resting well at night and needs more sleep, which I reassured her is not related to myasthenia.  UPDATE 07/24/2014:  She complains of achy pain involving her right leg, which has been ongoing for sometime.  It is worse with prolonged walking for 15 minutes.  It starts in the lower portion of her leg and  radiation up her thigh and low back.  She missed her appointment with her primary care provider.  Denies any numbness, tingling, or weakness.  Regarding her myasthenia, symptoms are stable without any worsening of diplopia, slurred speech, or dysarthria. She is taking prednisone 15mg  daily and mestinon 60mg  twice daily.     Medications:  Current Outpatient  Prescriptions on File Prior to Visit  Medication Sig Dispense Refill  . aspirin 81 MG tablet Take 1 tablet (81 mg total) by mouth daily. 60 tablet 2  . escitalopram (LEXAPRO) 5 MG tablet     . esomeprazole (NEXIUM) 40 MG capsule Take 40 mg by mouth daily at 12 noon.    . metFORMIN (GLUCOPHAGE) 500 MG tablet     . metoprolol succinate (TOPROL-XL) 50 MG 24 hr tablet Take 50 mg by mouth daily. Take with or immediately following a meal.    . predniSONE (DELTASONE) 10 MG tablet TAKE ONE TABLET BY MOUTH ONCE DAILY WITH  BREAKFAST 90 tablet 0  . pyridostigmine (MESTINON) 60 MG tablet One tablet twice daily (10am and 5pm). 180 tablet 2  . ranitidine (ZANTAC) 75 MG tablet Take 150 mg by mouth daily as needed for heartburn.    . rosuvastatin (CRESTOR) 10 MG tablet Take 10 mg by mouth daily.    . sitaGLIPtin (JANUVIA) 100 MG tablet Take 100 mg by mouth daily.    . THIAMINE HCL PO Take 1 tablet by mouth daily.    Jabier Gauss 40-10-25 MG TABS      No current facility-administered medications on file prior to visit.    Allergies:  Allergies  Allergen Reactions  . Sulfa Antibiotics Rash  . Pork-Derived Products      Review of Systems:  CONSTITUTIONAL: No fevers, chills, night sweats, or weight loss.   EYES: +visual changes or eye pain + tearing ENT: No hearing changes.  No history of nose bleeds.   RESPIRATORY: No cough, wheezing +shortness of breath.   CARDIOVASCULAR: Negative for chest pain, and palpitations.   GI: Negative for abdominal discomfort, blood in stools or black stools.  No recent change in bowel habits.   GU:  No history of incontinence.   MUSCLOSKELETAL: No history of joint pain or swelling.  No myalgias.   SKIN: Negative for lesions, rash, and itching.   ENDOCRINE: Negative for cold or heat intolerance, polydipsia or goiter.   PSYCH:  No depression or anxiety symptoms.   NEURO: As Above.   Vital Signs:  Ht 5\' 1"  (1.549 m)  General:  Well-appearing, cushingoid appearance  of the face  Neurological Exam: MENTAL STATUS including orientation to time, place, person, recent and remote memory, attention span and concentration, language, and fund of knowledge is normal.  Speech is not dysarthric.  CRANIAL NERVES:   Pupils equal round and reactive to light.  Mild opthalmoplegia OU especially with lateral gaze, mild esotropia of the left eye, mild ptosis (left).  Orbicularis oculi and orbicularis oris are 5/5.  Buccinator is 5/5.  Tongue strength is 5/5.  MOTOR:  Motor strength is 5/5 in all extremities, including neck flexion and extension and hip flexors.    SENSATION:  Vibration intact throughout  REFLEXES:  Reflexes are 2+/4 in the upper extremities, 3+ at the patella, and 2+/4 Achilles bilaterally.   COORDINATION/GAIT:  Gait narrow based and stable.   Data: Lab Results  Component Value Date   ALT 21 05/13/2014   AST 17 05/13/2014   ALKPHOS 58 05/13/2014   BILITOT 0.6 05/13/2014  Lab Results  Component Value Date   WBC 9.6 05/13/2014   HGB 13.6 05/13/2014   HCT 41.0 05/13/2014   MCV 91.1 05/13/2014   PLT 253 05/13/2014    Myasthenia AChR binding, blocking, and modulating antibody 10/13/2013: Negative Lab Results  Component Value Date   TSH 0.710 10/22/2013   MRI brain 10/13/2013: Mild chronic microvascular ischemic change. No acute abnormality.   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 Right median neuropathy, at or distal to the wrist, consistent with the clinical diagnosis of carpal tunnel syndrome. Overall, these findings are mild to moderate in degree electrically.  CT chest 10/30/2013: No acute finding or finding to explain the patient's symptoms.  Diffuse fatty infiltration of the liver. Subtle nodularity of the liver border is compatible with cirrhosis.  Calcific aortic and coronary atherosclerosis.    IMPRESSION: Seronegative generalized myasthenia  gravis (diagnosed 10/23/2013) Presenting symptoms:  fatigue, diplopia, dysarthria, and dysphagia in August 2015 Clinically stable, with persistent bilateral lateral gaze opthalmoplegia, left ptosis Given that she has been unable to taper prednisone < 15mg , will start her on steroid sparing agent, azathrioprine.  Common side effects including GI upset were discussed, including rare reports of leukemia. As we introduce this medication, close monitoring of her liver function tests and blood count will be followed.  Right leg pain, unlikely to represent radiculopathy based on the achy nature of her symptoms.  Considerations include muscle spasm, but she feels that it is more bone pain. Recommended tylenol or NSAIDs to see if that helps, if no improvement, follow-up with PCP  Blurry vision, recommended eye evaluation, ?worsening cataracts  Diabetes mellitus, followed by PCP   PLAN/RECOMMENDATIONS:  1.  Reduce prednisone to alternating days 15mg  daily (1.5 tablets) and 10mg  every other day 2.  Continue mestinon to 60mg  to twice daily (10am and 5pm) 3.  Start azathioprine 50mg  daily for one month, then increase to 100mg  daily 4.  Check CBC and CMP weekly x 4 weeks, then monthly x 3 months, then every 3 months 5.  For leg pain, recommended tylenol or NSAIDs to see if that helps, if no improvement, follow-up with PCP.  She may need a dexa scan, if not already done 6.  Continue taking nexium and calcium supplements 7.  Return to clinic in 4-6 weeks   The duration of this appointment visit was 30 minutes of face-to-face time with the patient.  Greater than 50% of this time was spent in counseling, explanation of diagnosis, planning of further management, and coordination of care.   Thank you for allowing me to participate in patient's care.  If I can answer any additional questions, I would be pleased to do so.    Sincerely,    Donika K. Posey Pronto, DO

## 2014-07-24 NOTE — Patient Instructions (Addendum)
1.  Start azathioprine 50mg  1 tablet daily for one month, then increase to 2 tablets daily.  As we start this medication, we will follow your blood work weekly for a month, then monthly for 3 months 2.  Reduce prednisone to 15mg  alternating with 10mg  each day 3.  Take tylenol 325mg  as needed for leg pain 4.  Return to clinic in 1 months and we will recheck your blood work

## 2014-09-11 ENCOUNTER — Ambulatory Visit: Payer: 59 | Admitting: Neurology

## 2014-09-15 ENCOUNTER — Other Ambulatory Visit: Payer: Self-pay | Admitting: *Deleted

## 2014-09-15 ENCOUNTER — Telehealth: Payer: Self-pay | Admitting: Neurology

## 2014-09-15 DIAGNOSIS — Z79899 Other long term (current) drug therapy: Secondary | ICD-10-CM

## 2014-09-15 NOTE — Telephone Encounter (Signed)
Pt called and has a question regarding her blood work/Dawn CB# [phone]

## 2014-09-15 NOTE — Telephone Encounter (Signed)
I called patient back and instructed her to come in for lab work as Dr. Posey Pronto had ordered.  I will give her a copy of the times to come in for labs.  (weekly x 4 weeks, monthly x 3 months then every 3 months).

## 2014-09-16 ENCOUNTER — Other Ambulatory Visit: Payer: 59

## 2014-09-16 DIAGNOSIS — Z79899 Other long term (current) drug therapy: Secondary | ICD-10-CM

## 2014-09-16 LAB — CBC
HCT: 38.6 % (ref 36.0–46.0)
Hemoglobin: 13.1 g/dL (ref 12.0–15.0)
MCHC: 33.9 g/dL (ref 30.0–36.0)
MCV: 90.5 fl (ref 78.0–100.0)
Platelets: 231 10*3/uL (ref 150.0–400.0)
RBC: 4.27 Mil/uL (ref 3.87–5.11)
RDW: 13 % (ref 11.5–15.5)
WBC: 10.1 10*3/uL (ref 4.0–10.5)

## 2014-09-16 LAB — COMPREHENSIVE METABOLIC PANEL
ALBUMIN: 4 g/dL (ref 3.5–5.2)
ALT: 18 U/L (ref 0–35)
AST: 16 U/L (ref 0–37)
Alkaline Phosphatase: 60 U/L (ref 39–117)
BUN: 13 mg/dL (ref 6–23)
CALCIUM: 9 mg/dL (ref 8.4–10.5)
CO2: 33 mEq/L — ABNORMAL HIGH (ref 19–32)
Chloride: 99 mEq/L (ref 96–112)
Creatinine, Ser: 0.89 mg/dL (ref 0.40–1.20)
GFR: 67.7 mL/min (ref >60.00)
Glucose, Bld: 142 mg/dL — ABNORMAL HIGH (ref 70–99)
POTASSIUM: 3.8 meq/L (ref 3.5–5.1)
SODIUM: 138 meq/L (ref 135–145)
TOTAL PROTEIN: 6.8 g/dL (ref 6.0–8.3)
Total Bilirubin: 0.4 mg/dL (ref 0.2–1.2)

## 2014-09-19 ENCOUNTER — Ambulatory Visit: Payer: 59 | Admitting: Neurology

## 2014-09-23 ENCOUNTER — Other Ambulatory Visit: Payer: Self-pay | Admitting: *Deleted

## 2014-09-23 ENCOUNTER — Telehealth: Payer: Self-pay | Admitting: *Deleted

## 2014-09-23 ENCOUNTER — Other Ambulatory Visit (INDEPENDENT_AMBULATORY_CARE_PROVIDER_SITE_OTHER): Payer: Commercial Managed Care - HMO

## 2014-09-23 DIAGNOSIS — Z79899 Other long term (current) drug therapy: Secondary | ICD-10-CM

## 2014-09-23 LAB — COMPREHENSIVE METABOLIC PANEL
ALT: 22 U/L (ref 0–35)
AST: 19 U/L (ref 0–37)
Albumin: 4.1 g/dL (ref 3.5–5.2)
Alkaline Phosphatase: 57 U/L (ref 39–117)
BUN: 12 mg/dL (ref 6–23)
CALCIUM: 9.4 mg/dL (ref 8.4–10.5)
CO2: 35 mEq/L — ABNORMAL HIGH (ref 19–32)
Chloride: 98 mEq/L (ref 96–112)
Creatinine, Ser: 0.91 mg/dL (ref 0.40–1.20)
GFR: 65.98 mL/min (ref >60.00)
Glucose, Bld: 170 mg/dL — ABNORMAL HIGH (ref 70–99)
Potassium: 3.9 mEq/L (ref 3.5–5.1)
Sodium: 137 mEq/L (ref 135–145)
Total Bilirubin: 0.5 mg/dL (ref 0.2–1.2)
Total Protein: 6.7 g/dL (ref 6.0–8.3)

## 2014-09-23 LAB — CBC
HEMATOCRIT: 40.1 % (ref 36.0–46.0)
HEMOGLOBIN: 13.6 g/dL (ref 12.0–15.0)
MCHC: 33.8 g/dL (ref 30.0–36.0)
MCV: 90.6 fl (ref 78.0–100.0)
PLATELETS: 268 10*3/uL (ref 150.0–400.0)
RBC: 4.43 Mil/uL (ref 3.87–5.11)
RDW: 13.1 % (ref 11.5–15.5)
WBC: 10.9 10*3/uL — ABNORMAL HIGH (ref 4.0–10.5)

## 2014-09-23 NOTE — Telephone Encounter (Signed)
Called pharmacy and changed patient's Rx to a 90 day supply.

## 2014-09-29 ENCOUNTER — Other Ambulatory Visit: Payer: Self-pay | Admitting: *Deleted

## 2014-09-29 ENCOUNTER — Other Ambulatory Visit: Payer: 59

## 2014-09-29 DIAGNOSIS — Z79899 Other long term (current) drug therapy: Secondary | ICD-10-CM

## 2014-09-29 LAB — COMPREHENSIVE METABOLIC PANEL
ALBUMIN: 4.1 g/dL (ref 3.5–5.2)
ALT: 21 U/L (ref 0–35)
AST: 20 U/L (ref 0–37)
Alkaline Phosphatase: 58 U/L (ref 39–117)
BILIRUBIN TOTAL: 0.5 mg/dL (ref 0.2–1.2)
BUN: 14 mg/dL (ref 6–23)
CO2: 33 mEq/L — ABNORMAL HIGH (ref 19–32)
Calcium: 9.5 mg/dL (ref 8.4–10.5)
Chloride: 98 mEq/L (ref 96–112)
Creatinine, Ser: 0.94 mg/dL (ref 0.40–1.20)
GFR: 63.56 mL/min (ref >60.00)
Glucose, Bld: 151 mg/dL — ABNORMAL HIGH (ref 70–99)
POTASSIUM: 3.8 meq/L (ref 3.5–5.1)
Sodium: 137 mEq/L (ref 135–145)
Total Protein: 7 g/dL (ref 6.0–8.3)

## 2014-09-29 LAB — CBC
HCT: 40.5 % (ref 36.0–46.0)
Hemoglobin: 13.5 g/dL (ref 12.0–15.0)
MCHC: 33.3 g/dL (ref 30.0–36.0)
MCV: 91.4 fl (ref 78.0–100.0)
PLATELETS: 251 10*3/uL (ref 150.0–400.0)
RBC: 4.43 Mil/uL (ref 3.87–5.11)
RDW: 13.2 % (ref 11.5–15.5)
WBC: 10 10*3/uL (ref 4.0–10.5)

## 2014-10-07 ENCOUNTER — Other Ambulatory Visit: Payer: Self-pay | Admitting: *Deleted

## 2014-10-07 ENCOUNTER — Other Ambulatory Visit (INDEPENDENT_AMBULATORY_CARE_PROVIDER_SITE_OTHER): Payer: Commercial Managed Care - HMO

## 2014-10-07 DIAGNOSIS — Z79899 Other long term (current) drug therapy: Secondary | ICD-10-CM

## 2014-10-07 LAB — COMPREHENSIVE METABOLIC PANEL
ALBUMIN: 4.4 g/dL (ref 3.5–5.2)
ALK PHOS: 54 U/L (ref 39–117)
ALT: 40 U/L — ABNORMAL HIGH (ref 0–35)
AST: 43 U/L — ABNORMAL HIGH (ref 0–37)
BUN: 14 mg/dL (ref 6–23)
CALCIUM: 9.8 mg/dL (ref 8.4–10.5)
CO2: 35 mEq/L — ABNORMAL HIGH (ref 19–32)
Chloride: 98 mEq/L (ref 96–112)
Creatinine, Ser: 0.92 mg/dL (ref 0.40–1.20)
GFR: 65.15 mL/min (ref >60.00)
GLUCOSE: 127 mg/dL — AB (ref 70–99)
POTASSIUM: 3.9 meq/L (ref 3.5–5.1)
Sodium: 137 mEq/L (ref 135–145)
TOTAL PROTEIN: 7.3 g/dL (ref 6.0–8.3)
Total Bilirubin: 0.5 mg/dL (ref 0.2–1.2)

## 2014-10-07 LAB — CBC
HEMATOCRIT: 40.4 % (ref 36.0–46.0)
HEMOGLOBIN: 13.6 g/dL (ref 12.0–15.0)
MCHC: 33.6 g/dL (ref 30.0–36.0)
MCV: 91.5 fl (ref 78.0–100.0)
PLATELETS: 248 10*3/uL (ref 150.0–400.0)
RBC: 4.42 Mil/uL (ref 3.87–5.11)
RDW: 13.2 % (ref 11.5–15.5)
WBC: 9.9 10*3/uL (ref 4.0–10.5)

## 2014-10-20 ENCOUNTER — Other Ambulatory Visit: Payer: Self-pay | Admitting: *Deleted

## 2014-10-20 ENCOUNTER — Other Ambulatory Visit (INDEPENDENT_AMBULATORY_CARE_PROVIDER_SITE_OTHER): Payer: Commercial Managed Care - HMO

## 2014-10-20 DIAGNOSIS — Z79899 Other long term (current) drug therapy: Secondary | ICD-10-CM

## 2014-10-20 LAB — CBC
HEMATOCRIT: 39.2 % (ref 36.0–46.0)
Hemoglobin: 13.1 g/dL (ref 12.0–15.0)
MCHC: 33.5 g/dL (ref 30.0–36.0)
MCV: 91.4 fl (ref 78.0–100.0)
PLATELETS: 277 10*3/uL (ref 150.0–400.0)
RBC: 4.29 Mil/uL (ref 3.87–5.11)
RDW: 13.3 % (ref 11.5–15.5)
WBC: 10.6 10*3/uL — AB (ref 4.0–10.5)

## 2014-10-20 LAB — COMPREHENSIVE METABOLIC PANEL
ALBUMIN: 4.1 g/dL (ref 3.5–5.2)
ALK PHOS: 49 U/L (ref 39–117)
ALT: 29 U/L (ref 0–35)
AST: 24 U/L (ref 0–37)
BUN: 15 mg/dL (ref 6–23)
CALCIUM: 9.5 mg/dL (ref 8.4–10.5)
CHLORIDE: 100 meq/L (ref 96–112)
CO2: 33 mEq/L — ABNORMAL HIGH (ref 19–32)
CREATININE: 0.98 mg/dL (ref 0.40–1.20)
GFR: 60.56 mL/min (ref >60.00)
Glucose, Bld: 162 mg/dL — ABNORMAL HIGH (ref 70–99)
Potassium: 3.7 mEq/L (ref 3.5–5.1)
SODIUM: 139 meq/L (ref 135–145)
TOTAL PROTEIN: 6.7 g/dL (ref 6.0–8.3)
Total Bilirubin: 0.5 mg/dL (ref 0.2–1.2)

## 2014-11-13 ENCOUNTER — Ambulatory Visit (INDEPENDENT_AMBULATORY_CARE_PROVIDER_SITE_OTHER): Payer: Commercial Managed Care - HMO | Admitting: Neurology

## 2014-11-13 ENCOUNTER — Other Ambulatory Visit (INDEPENDENT_AMBULATORY_CARE_PROVIDER_SITE_OTHER): Payer: Commercial Managed Care - HMO

## 2014-11-13 ENCOUNTER — Encounter: Payer: Self-pay | Admitting: Neurology

## 2014-11-13 VITALS — BP 120/70 | HR 56 | Ht 61.0 in | Wt 151.3 lb

## 2014-11-13 DIAGNOSIS — Z79899 Other long term (current) drug therapy: Secondary | ICD-10-CM

## 2014-11-13 DIAGNOSIS — Z5181 Encounter for therapeutic drug level monitoring: Secondary | ICD-10-CM

## 2014-11-13 DIAGNOSIS — G7000 Myasthenia gravis without (acute) exacerbation: Secondary | ICD-10-CM

## 2014-11-13 LAB — COMPREHENSIVE METABOLIC PANEL
ALT: 23 U/L (ref 6–29)
AST: 24 U/L (ref 10–35)
Albumin: 3.9 g/dL (ref 3.6–5.1)
Alkaline Phosphatase: 48 U/L (ref 33–130)
BILIRUBIN TOTAL: 0.6 mg/dL (ref 0.2–1.2)
BUN: 14 mg/dL (ref 7–25)
CHLORIDE: 99 mmol/L (ref 98–110)
CO2: 30 mmol/L (ref 20–31)
CREATININE: 0.92 mg/dL (ref 0.50–0.99)
Calcium: 9.4 mg/dL (ref 8.6–10.4)
GLUCOSE: 109 mg/dL — AB (ref 65–99)
Potassium: 4 mmol/L (ref 3.5–5.3)
SODIUM: 141 mmol/L (ref 135–146)
Total Protein: 6.3 g/dL (ref 6.1–8.1)

## 2014-11-13 LAB — CBC
HCT: 40.5 % (ref 36.0–46.0)
Hemoglobin: 13.6 g/dL (ref 12.0–15.0)
MCHC: 33.6 g/dL (ref 30.0–36.0)
MCV: 91.5 fl (ref 78.0–100.0)
Platelets: 258 10*3/uL (ref 150.0–400.0)
RBC: 4.43 Mil/uL (ref 3.87–5.11)
RDW: 13.4 % (ref 11.5–15.5)
WBC: 9.5 10*3/uL (ref 4.0–10.5)

## 2014-11-13 NOTE — Progress Notes (Signed)
Note routed

## 2014-11-13 NOTE — Patient Instructions (Addendum)
1.  Reduce prednisone to alternating days 15mg  daily (1.5 tablets) and 10mg  every other day for one month      If you are doing well after one month, reduce to prednisone 10mg  daily      If you develop worsening double vision, difficulty swallowing/talking, go back to taking prednisone 15mg  daily 2.  Continue mestinon to 60mg  to twice daily (10am and 5pm) 3.  Continue azathioprine 100mg  daily 4.  Check blood work monthly 5.  Please get your eyes checked 6.  Return to clinic in 2 months

## 2014-11-13 NOTE — Progress Notes (Signed)
Follow-up Visit   Date: 11/13/2014   Cassidy Carlson MRN: [id number] DOB: [birth year]   Interim History: Cassidy Carlson is a 64 y.o. right-handed Bengali female with history of diabetes mellitus (diagnosed 2014), hyperlipidemia, GERD and hypertension returning for follow-up of seronegative myasthenia gravis.  She is accompanied by her husband.   History of present illness: Starting on 10/12/2013, she felt weakness of the tongue, double vision, difficulty keeping her eyes, and speech difficulty. She went to the emergency department where she was admitted overnight and had MRI brain which did not show acute stroke. Labs including CK and ACRh antibodies (binding, blocking, and modulating) were nondiagnostic and she was recommended to follow up with neurology for further evaluation.  She developed sluured speech, generalized fatigue, and double vision, described images on top of each other. Of late, she has started to sleep on 4 pillows because laying flat makes her short of breath.  She has no weakness of her arms and legs, but does describe generalized fatigue.  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  She continues to have difficulty swallowing, talking, and complains of double vision.  Shortness of breath is stable.  UPDATE 11/04/2013:  She started mestinon 60mg  and noticed improvement of her voice and vision. She continues to have difficulty swallowing larger pills and water.  No more double vision, but endorses blurry visions and problems with depth perception.    UPDATE 12/09/2013:  Because of mild dysphagia, prednisone was increased to 15mg  daily but due to GERD she only did this for 10 days and self-tapered back to 10mg  daily.   UPDATE 02/26/2014:  She reports having "blurry vision" especially when looking at distant objects.  She had her eyes checked in November and acuity was reportedly 20/20.  She also complains of watering of her eyes.  Abdominal cramps  have resolved since reducing mestinon to twice daily.  She has no new complaints of dysphagia, dysarthria, or weakness.    UPDATE 04/14/2014:  She reports coming back from [place] in mid-February and developed a dry cough.  She also complains of itching in her throat, runny eyes.  She has noticed that swallowing is worse and she is having some choking spells.  She is currently taking prednisone 10mg  daily and mestinon 60mg  three times daily.   UPDATE 05/13/2014:  At her last visit, prednisone was increased to 15mg  daily and she is doing much better.  Swallowing and ptosis is improved.  She continues to have blurry vision and intermittent double vision.   UPDATE 07/24/2014:  She complains of achy pain involving her right leg, which has been ongoing for sometime.  It is worse with prolonged walking for 15 minutes.  It starts in the lower portion of her leg and radiation up her thigh and low back.  She missed her appointment with her primary care provider.   Regarding her myasthenia, symptoms are stable without any worsening of diplopia, slurred speech, or dysarthria. She is taking prednisone 15mg  daily and mestinon 60mg  twice daily.    UPDATE 11/13/2014:  She reports doing fair, some days are better than other.  Blurry vision remains persistent, which I have stressed is not due to her MG, she has not had her eyes checked.  No swallowing or slurred speech.  At her last visit, she was instructed to taper prednisone to 15/10 on alternating days, but she did not do this and remains on prednisone 15mg  daily. No new complaints.  Medications:  Current Outpatient Prescriptions on File Prior to Visit  Medication Sig Dispense Refill  . aspirin 81 MG tablet Take 1 tablet (81 mg total) by mouth daily. 60 tablet 2  . azaTHIOprine (IMURAN) 50 MG tablet Take 1 tablet daily for one month, then taking 2 tablets daily. 60 tablet 5  . escitalopram (LEXAPRO) 5 MG tablet     . esomeprazole (NEXIUM) 40 MG capsule Take 40  mg by mouth daily at 12 noon.    . metFORMIN (GLUCOPHAGE) 500 MG tablet     . metoprolol succinate (TOPROL-XL) 50 MG 24 hr tablet Take 50 mg by mouth daily. Take with or immediately following a meal.    . predniSONE (DELTASONE) 10 MG tablet Alternate days with 15mg  and 10mg  (Patient taking differently: Take 50 mg qd) 90 tablet 0  . pyridostigmine (MESTINON) 60 MG tablet One tablet twice daily (10am and 5pm). 180 tablet 3  . ranitidine (ZANTAC) 75 MG tablet Take 150 mg by mouth daily as needed for heartburn.    . rosuvastatin (CRESTOR) 10 MG tablet Take 10 mg by mouth daily.    . sitaGLIPtin (JANUVIA) 100 MG tablet Take 100 mg by mouth daily.    . THIAMINE HCL PO Take 1 tablet by mouth daily.    Cassidy Carlson 40-10-25 MG TABS      No current facility-administered medications on file prior to visit.    Allergies:  Allergies  Allergen Reactions  . Sulfa Antibiotics Rash  . Pork-Derived Products      Review of Systems:  CONSTITUTIONAL: No fevers, chills, night sweats, or weight loss.   EYES: +visual changes or eye pain + tearing ENT: No hearing changes.  No history of nose bleeds.   RESPIRATORY: No cough, wheezing +shortness of breath.   CARDIOVASCULAR: Negative for chest pain, and palpitations.   GI: Negative for abdominal discomfort, blood in stools or black stools.  No recent change in bowel habits.   GU:  No history of incontinence.   MUSCLOSKELETAL: No history of joint pain or swelling.  No myalgias.   SKIN: Negative for lesions, rash, and itching.   ENDOCRINE: Negative for cold or heat intolerance, polydipsia or goiter.   PSYCH:  No depression or anxiety symptoms.   NEURO: As Above.   Vital Signs:  BP 120/70 mmHg  Pulse 56  Ht 5\' 1"  (1.549 m)  Wt 151 lb 5 oz (68.635 kg)  BMI 28.61 kg/m2  SpO2 98%  General:  Well-appearing, cushingoid appearance of the face  Neurological Exam: MENTAL STATUS including orientation to time, place, person, recent and remote memory,  attention span and concentration, language, and fund of knowledge is normal.  Speech is not dysarthric.  CRANIAL NERVES:   Pupils equal round and reactive to light.  Mild opthalmoplegia OU especially with lateral gaze, mild esotropia of the left eye, mild ptosis (left).  Orbicularis oculi and orbicularis oris are 5/5.  Buccinator is 5/5.  Tongue strength is 5/5.  MOTOR:  Motor strength is 5/5 in all extremities, including neck flexion and extension and hip flexors.    COORDINATION/GAIT:  Gait narrow based and stable.   Data: Lab Results  Component Value Date   ALT 29 10/20/2014   AST 24 10/20/2014   ALKPHOS 49 10/20/2014   BILITOT 0.5 10/20/2014   Lab Results  Component Value Date   WBC 10.6* 10/20/2014   HGB 13.1 10/20/2014   HCT 39.2 10/20/2014   MCV 91.4 10/20/2014   PLT 277.0 10/20/2014  Myasthenia AChR binding, blocking, and modulating antibody 10/13/2013: Negative Lab Results  Component Value Date   TSH 0.710 10/22/2013   MRI brain 10/13/2013: Mild chronic microvascular ischemic change. No acute abnormality.   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 Right median neuropathy, at or distal to the wrist, consistent with the clinical diagnosis of carpal tunnel syndrome. Overall, these findings are mild to moderate in degree electrically.  CT chest 10/30/2013: No acute finding or finding to explain the patient's symptoms.  Diffuse fatty infiltration of the liver. Subtle nodularity of the liver border is compatible with cirrhosis.  Calcific aortic and coronary atherosclerosis.    IMPRESSION: Seronegative generalized myasthenia gravis (diagnosed 10/23/2013) Presenting symptoms:  fatigue, diplopia, dysarthria, and dysphagia in August 2015 Clinically stable, with persistent bilateral lateral gaze opthalmoplegia, left ptosis Given that she has been unable to taper prednisone < 15mg , she was  started on steroid sparing agent, azathrioprine in June 2016 She is clinically stable, except for persistent blurry vision and double vision which I question whether there is underlying esotropia as she has been on adequate therapy for myasthenia without improvement of this.   Blurry vision, recommended eye evaluation, ?worsening cataracts  Diabetes mellitus, followed by PCP   PLAN/RECOMMENDATIONS:  1.  Reduce prednisone to alternating days 15mg  daily (1.5 tablets) and 10mg  every other day for one month, if stable, reduce to prednisone 10mg  daily 2.  Continue mestinon to 60mg  to twice daily (10am and 5pm) 3.  Continue azathioprine 100mg  daily 4.  Check CBC and CMP monthly x 3 months, then every 3 months.  Labs reviewed and stable.  5.  Recommend dilated eye exam  6.  Continue taking nexium and calcium supplements 7.  Return to clinic in 2 months   The duration of this appointment visit was 25 minutes of face-to-face time with the patient.  Greater than 50% of this time was spent in counseling, explanation of diagnosis, planning of further management, and coordination of care.   Thank you for allowing me to participate in patient's care.  If I can answer any additional questions, I would be pleased to do so.    Sincerely,    Donika K. Posey Pronto, DO

## 2014-12-05 ENCOUNTER — Telehealth: Payer: Self-pay | Admitting: Neurology

## 2014-12-05 NOTE — Telephone Encounter (Signed)
Left message for patient to call me back. 

## 2014-12-05 NOTE — Telephone Encounter (Signed)
VM-Pt called and asked for a call back but did not state why/Dawn CB# [phone]

## 2014-12-05 NOTE — Telephone Encounter (Signed)
Patient called me back and I instructed her to come in for labs next week.

## 2014-12-18 ENCOUNTER — Other Ambulatory Visit: Payer: Self-pay | Admitting: *Deleted

## 2014-12-18 ENCOUNTER — Other Ambulatory Visit (INDEPENDENT_AMBULATORY_CARE_PROVIDER_SITE_OTHER): Payer: Commercial Managed Care - HMO

## 2014-12-18 DIAGNOSIS — Z79899 Other long term (current) drug therapy: Secondary | ICD-10-CM | POA: Diagnosis not present

## 2014-12-18 LAB — CBC
HCT: 41.8 % (ref 36.0–46.0)
HEMOGLOBIN: 13.9 g/dL (ref 12.0–15.0)
MCHC: 33.2 g/dL (ref 30.0–36.0)
MCV: 91.7 fl (ref 78.0–100.0)
PLATELETS: 235 10*3/uL (ref 150.0–400.0)
RBC: 4.56 Mil/uL (ref 3.87–5.11)
RDW: 12.9 % (ref 11.5–15.5)
WBC: 8.4 10*3/uL (ref 4.0–10.5)

## 2014-12-18 LAB — COMPREHENSIVE METABOLIC PANEL
ALBUMIN: 4 g/dL (ref 3.5–5.2)
ALT: 16 U/L (ref 0–35)
AST: 21 U/L (ref 0–37)
Alkaline Phosphatase: 52 U/L (ref 39–117)
BILIRUBIN TOTAL: 0.5 mg/dL (ref 0.2–1.2)
BUN: 16 mg/dL (ref 6–23)
CO2: 34 meq/L — AB (ref 19–32)
Calcium: 9.6 mg/dL (ref 8.4–10.5)
Chloride: 98 mEq/L (ref 96–112)
Creatinine, Ser: 0.96 mg/dL (ref 0.40–1.20)
GFR: 61.99 mL/min (ref >60.00)
GLUCOSE: 220 mg/dL — AB (ref 70–99)
Potassium: 3.7 mEq/L (ref 3.5–5.1)
Sodium: 137 mEq/L (ref 135–145)
Total Protein: 6.8 g/dL (ref 6.0–8.3)

## 2014-12-31 ENCOUNTER — Ambulatory Visit (INDEPENDENT_AMBULATORY_CARE_PROVIDER_SITE_OTHER): Payer: Commercial Managed Care - HMO | Admitting: Neurology

## 2014-12-31 ENCOUNTER — Encounter: Payer: Self-pay | Admitting: Neurology

## 2014-12-31 VITALS — BP 120/68 | HR 62 | Ht 61.0 in | Wt 151.4 lb

## 2014-12-31 DIAGNOSIS — G7000 Myasthenia gravis without (acute) exacerbation: Secondary | ICD-10-CM

## 2014-12-31 DIAGNOSIS — Z5181 Encounter for therapeutic drug level monitoring: Secondary | ICD-10-CM

## 2014-12-31 MED ORDER — PREDNISONE 5 MG PO TABS: ORAL_TABLET | ORAL | Status: DC

## 2014-12-31 MED ORDER — AZATHIOPRINE 100 MG PO TABS: 100.0000 mg | ORAL_TABLET | Freq: Every day | ORAL | Status: DC

## 2014-12-31 MED ORDER — PYRIDOSTIGMINE BROMIDE 60 MG PO TABS: ORAL_TABLET | ORAL | Status: DC

## 2014-12-31 NOTE — Patient Instructions (Addendum)
1.  Reduce prednisone 5mg  tablets as follows:  10mg  (2 tablets) alternative with 7.5 (1.5 tablets) for two weeks      If you are doing well after 2 weeks, go down to 7.5mg  (1.5 tablets daily) 2.  Continue imuran 100mg  daily 3.  Continue mestinon 60mg  twice daily 4.  Check blood work in end of January/early February 5.  Return to clinic in January/early February

## 2014-12-31 NOTE — Progress Notes (Signed)
Follow-up Visit   Date: 12/31/2014   Cassidy Carlson MRN: [id number] DOB: 1950-10-21   Interim History: Cassidy Carlson is a 64 y.o. right-handed Bengali female with history of diabetes mellitus (diagnosed 2014), hyperlipidemia, GERD and hypertension returning for follow-up of seronegative myasthenia gravis.  She is accompanied by her husband.   History of present illness: Starting on 10/12/2013, she felt weakness of the tongue, double vision, difficulty keeping her eyes, and speech difficulty. She went to the emergency department where she was admitted overnight and had MRI brain which did not show acute stroke. Labs including CK and ACRh antibodies (binding, blocking, and modulating) were nondiagnostic and she was recommended to follow up with neurology for further evaluation.  She developed sluured speech, generalized fatigue, and double vision, described images on top of each other. Of late, she has started to sleep on 4 pillows because laying flat makes her short of breath.  She has no weakness of her arms and legs, but does describe generalized fatigue.  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  She continues to have difficulty swallowing, talking, and complains of double vision.  Shortness of breath is stable.  UPDATE 11/04/2013:  She started mestinon 60mg  and noticed improvement of her voice and vision. She continues to have difficulty swallowing larger pills and water.  No more double vision, but endorses blurry visions and problems with depth perception.    UPDATE 12/09/2013:  Because of mild dysphagia, prednisone was increased to 15mg  daily but due to GERD she only did this for 10 days and self-tapered back to 10mg  daily.   UPDATE 02/26/2014:  She reports having "blurry vision" especially when looking at distant objects.  She had her eyes checked in November and acuity was reportedly 20/20.  She also complains of watering of her eyes.  Abdominal cramps  have resolved since reducing mestinon to twice daily.   UPDATE 05/13/2014:  At her last visit, prednisone was increased to 15mg  daily and she is doing much better.  Swallowing and ptosis is improved.  She continues to have blurry vision and intermittent double vision.   UPDATE 07/24/2014:  She complains of achy pain involving her right leg, which has been ongoing for sometime.  It is worse with prolonged walking for 15 minutes.  It starts in the lower portion of her leg and radiation up her thigh and low back.  She missed her appointment with her primary care provider.   Regarding her myasthenia, symptoms are stable without any worsening of diplopia, slurred speech, or dysarthria. She is taking prednisone 15mg  daily and mestinon 60mg  twice daily.    UPDATE 11/13/2014:  She reports doing fair, some days are better than other.  Blurry vision remains persistent, which I have stressed is not due to her MG, she has not had her eyes checked.  No swallowing or slurred speech.  At her last visit, she was instructed to taper prednisone to 15/10 on alternating days, but she did not do this and remains on prednisone 15mg  daily. No new complaints.    UPDATE 12/31/2014:  She has been successfully able to taper prednisone to 10mg  daily.  No new issues with weakness, double vision, or dysphagia.  Her blurry vision is also better and only intermittent now.  No new complaints today.   Medications:  Current Outpatient Prescriptions on File Prior to Visit  Medication Sig Dispense Refill  . aspirin 81 MG tablet Take 1 tablet (81 mg total) by mouth  daily. 60 tablet 2  . azaTHIOprine (IMURAN) 50 MG tablet Take 1 tablet daily for one month, then taking 2 tablets daily. 60 tablet 5  . escitalopram (LEXAPRO) 5 MG tablet     . esomeprazole (NEXIUM) 40 MG capsule Take 40 mg by mouth daily at 12 noon.    Marland Kitchen FARXIGA 5 MG TABS tablet     . metFORMIN (GLUCOPHAGE) 500 MG tablet     . metoprolol succinate (TOPROL-XL) 50 MG 24 hr tablet  Take 50 mg by mouth daily. Take with or immediately following a meal.    . ONETOUCH VERIO test strip     . predniSONE (DELTASONE) 10 MG tablet Alternate days with 15mg  and 10mg  (Patient taking differently: Take 50 mg qd) 90 tablet 0  . pyridostigmine (MESTINON) 60 MG tablet One tablet twice daily (10am and 5pm). 180 tablet 3  . ranitidine (ZANTAC) 75 MG tablet Take 150 mg by mouth daily as needed for heartburn.    . rosuvastatin (CRESTOR) 10 MG tablet Take 10 mg by mouth daily.    . sitaGLIPtin (JANUVIA) 100 MG tablet Take 100 mg by mouth daily.    . THIAMINE HCL PO Take 1 tablet by mouth daily.    Jabier Gauss 40-10-25 MG TABS      No current facility-administered medications on file prior to visit.    Allergies:  Allergies  Allergen Reactions  . Sulfa Antibiotics Rash  . Pork-Derived Products      Review of Systems:  CONSTITUTIONAL: No fevers, chills, night sweats, or weight loss.   EYES: +visual changes or eye pain + tearing ENT: No hearing changes.  No history of nose bleeds.   RESPIRATORY: No cough, wheezing +shortness of breath.   CARDIOVASCULAR: Negative for chest pain, and palpitations.   GI: Negative for abdominal discomfort, blood in stools or black stools.  No recent change in bowel habits.   GU:  No history of incontinence.   MUSCLOSKELETAL: No history of joint pain or swelling.  No myalgias.   SKIN: Negative for lesions, rash, and itching.   ENDOCRINE: Negative for cold or heat intolerance, polydipsia or goiter.   PSYCH:  No depression or anxiety symptoms.   NEURO: As Above.   Vital Signs:  BP 120/68 mmHg  Pulse 62  Ht 5\' 1"  (1.549 m)  Wt 151 lb 6 oz (68.663 kg)  BMI 28.62 kg/m2  SpO2 97%   Neurological Exam: MENTAL STATUS including orientation to time, place, person, recent and remote memory, attention span and concentration, language, and fund of knowledge is normal.  Speech is not dysarthric.  CRANIAL NERVES:   Pupils equal round and reactive to light.   Mild opthalmoplegia OU especially with lateral gaze, mild esotropia of the left eye, no ptosis (improved).  Orbicularis oculi and orbicularis oris are 5/5.  Buccinator is 5/5.  Tongue strength is 5/5.  MOTOR:  Motor strength is 5/5 in all extremities.  COORDINATION/GAIT:  Gait narrow based and stable.   Data: Lab Results  Component Value Date   ALT 16 12/18/2014   AST 21 12/18/2014   ALKPHOS 52 12/18/2014   BILITOT 0.5 12/18/2014   Lab Results  Component Value Date   WBC 8.4 12/18/2014   HGB 13.9 12/18/2014   HCT 41.8 12/18/2014   MCV 91.7 12/18/2014   PLT 235.0 12/18/2014    Myasthenia AChR binding, blocking, and modulating antibody 10/13/2013: Negative Lab Results  Component Value Date   TSH 0.710 10/22/2013   MRI brain 10/13/2013:  Mild chronic microvascular ischemic change. No acute abnormality.   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 Right median neuropathy, at or distal to the wrist, consistent with the clinical diagnosis of carpal tunnel syndrome. Overall, these findings are mild to moderate in degree electrically.  CT chest 10/30/2013: No acute finding or finding to explain the patient's symptoms.  Diffuse fatty infiltration of the liver. Subtle nodularity of the liver border is compatible with cirrhosis.  Calcific aortic and coronary atherosclerosis.    IMPRESSION: Seronegative generalized myasthenia gravis (diagnosed 10/23/2013) Presenting symptoms:  fatigue, diplopia, dysarthria, and dysphagia in August 2015 Clinically stable, with persistent bilateral lateral gaze opthalmoplegia (?esotropia), left ptosis (improved today) Given that she has been unable to taper prednisone < 15mg , she was started on steroid sparing agent, azathrioprine in June 2016 She has been able to taper her prednisone to 10mg  daily and remains stable.    Blurry vision, improved but she has an eye evaluation  later this month  Diabetes mellitus, followed by PCP   PLAN/RECOMMENDATIONS:  1.  Reduce prednisone to alternating days 10mg  daily and alternative with 7.5mg  for two weeks, then reduce to 7.5mg  daily.  New prescription provided for 5mg  tablets. 2.  Continue mestinon to 60mg  to twice daily (10am and 5pm) 3.  Continue azathioprine 100mg  daily 4.  Check CBC and CMP every 3 months.  Labs reviewed and stable.  5.  Continue taking nexium and calcium supplements  Return to clinic in 3 months   The duration of this appointment visit was 25 minutes of face-to-face time with the patient.  Greater than 50% of this time was spent in counseling, explanation of diagnosis, planning of further management, and coordination of care.   Thank you for allowing me to participate in patient's care.  If I can answer any additional questions, I would be pleased to do so.    Sincerely,    Amairany Schumpert K. Posey Pronto, DO

## 2015-04-20 ENCOUNTER — Encounter: Payer: Self-pay | Admitting: Neurology

## 2015-04-20 ENCOUNTER — Ambulatory Visit (INDEPENDENT_AMBULATORY_CARE_PROVIDER_SITE_OTHER): Payer: Commercial Managed Care - HMO | Admitting: Neurology

## 2015-04-20 VITALS — BP 104/64 | HR 63 | Ht 61.0 in | Wt 151.1 lb

## 2015-04-20 DIAGNOSIS — G7000 Myasthenia gravis without (acute) exacerbation: Secondary | ICD-10-CM | POA: Diagnosis not present

## 2015-04-20 DIAGNOSIS — Z79899 Other long term (current) drug therapy: Secondary | ICD-10-CM

## 2015-04-20 DIAGNOSIS — Z5181 Encounter for therapeutic drug level monitoring: Secondary | ICD-10-CM

## 2015-04-20 MED ORDER — PREDNISONE 5 MG PO TABS: ORAL_TABLET | ORAL | Status: DC

## 2015-04-20 NOTE — Patient Instructions (Addendum)
Start taking prednisone as follows:   Week 1 - 2 7.5mg  daily (1.5 tablets)   Week 3 - 4 Alternate days with 1.5 tablets and 1 tab   After week 4 (April),   Take 5mg  (1 tablet) daily and continue this  Continue the rest of your medications as you are taking them  Check blood work next week  Return to clinic in 2 months

## 2015-04-20 NOTE — Progress Notes (Signed)
Follow-up Visit   Date: 04/20/2015   Cassidy Carlson MRN: [id number] DOB: 04-02-1950   Interim History: Cassidy Carlson is a 65 y.o. right-handed Bengali female with history of diabetes mellitus (diagnosed 2014), hyperlipidemia, GERD and hypertension returning for follow-up of seronegative myasthenia gravis.  She is accompanied by her husband.   History of present illness: Starting on 10/12/2013, she felt weakness of the tongue, double vision, difficulty keeping her eyes, and speech difficulty. She went to the emergency department where she was admitted overnight and had MRI brain which did not show acute stroke. Labs including CK and ACRh antibodies (binding, blocking, and modulating) were nondiagnostic and she was recommended to follow up with neurology for further evaluation.  She developed sluured speech, generalized fatigue, and double vision, described images on top of each other. Of late, she has started to sleep on 4 pillows because laying flat makes her short of breath.  She has no weakness of her arms and legs, but does describe generalized fatigue.  In September, she underwent EMG which showed post-synaptic NMJ disorder consistent with MG.  She started mestinon 60mg  and prednisone 10mg  daily and noticed improvement of her voice and vision. Because of mild dysphagia, prednisone was increased to 15mg  daily but due to GERD she only did this for 10 days and self-tapered back to 10mg  daily.   UPDATE 07/24/2014:  She complains of achy pain involving her right leg, which has been ongoing for sometime.  It is worse with prolonged walking for 15 minutes.  It starts in the lower portion of her leg and radiation up her thigh and low back.  She missed her appointment with her primary care provider.   Regarding her myasthenia, symptoms are stable without any worsening of diplopia, slurred speech, or dysarthria. She is taking prednisone 15mg  daily and mestinon 60mg  twice daily.  Imuran 100mg  started.    UPDATE 11/13/2014:  She reports doing fair, some days are better than other.  Blurry vision remains persistent, which I have stressed is not due to her MG, she has not had her eyes checked.  No swallowing or slurred speech.  At her last visit, she was instructed to taper prednisone to 15/10 on alternating days, but she did not do this and remains on prednisone 15mg  daily.   UPDATE 12/31/2014:  She has been successfully able to taper prednisone to 10mg  daily.    Her blurry vision is also better and only intermittent now.    UPDATE 04/20/2015:  She is taking prednisone 10mg  alternating with 7.5mg  and feels well.  She did not reduce it further to 7.5mg  daily.  No new complaints.  She was told she has cataracts and could have surgery when she was ready.  She has several questions about whether she to have cataracts surgery, but requested that she discuss this with her ophthalmologist.  Patient and husband both feel that Imuran, which was started in June, has helped. No new issues with weakness, double vision, or dysphagia.  Medications:  Current Outpatient Prescriptions on File Prior to Visit  Medication Sig Dispense Refill  . aspirin 81 MG tablet Take 1 tablet (81 mg total) by mouth daily. 60 tablet 2  . azathioprine (IMURAN) 100 MG tablet Take 1 tablet (100 mg total) by mouth daily. 90 tablet 3  . escitalopram (LEXAPRO) 5 MG tablet     . esomeprazole (NEXIUM) 40 MG capsule Take 40 mg by mouth daily at 12 noon.    Marland Kitchen FARXIGA 5 MG TABS  tablet     . metFORMIN (GLUCOPHAGE) 500 MG tablet     . metoprolol succinate (TOPROL-XL) 50 MG 24 hr tablet Take 50 mg by mouth daily. Take with or immediately following a meal.    . ONETOUCH VERIO test strip     . pyridostigmine (MESTINON) 60 MG tablet One tablet twice daily (10am and 5pm). 180 tablet 3  . ranitidine (ZANTAC) 75 MG tablet Take 150 mg by mouth daily as needed for heartburn.    . rosuvastatin (CRESTOR) 10 MG tablet Take 10 mg by mouth daily.    .  sitaGLIPtin (JANUVIA) 100 MG tablet Take 100 mg by mouth daily.    . THIAMINE HCL PO Take 1 tablet by mouth daily.    Jabier Gauss 40-10-25 MG TABS      No current facility-administered medications on file prior to visit.    Allergies:  Allergies  Allergen Reactions  . Sulfa Antibiotics Rash  . Pork-Derived Products      Review of Systems:  CONSTITUTIONAL: No fevers, chills, night sweats, or weight loss.   EYES: +visual changes or eye pain + tearing ENT: No hearing changes.  No history of nose bleeds.   RESPIRATORY: No cough, wheezing +shortness of breath.   CARDIOVASCULAR: Negative for chest pain, and palpitations.   GI: Negative for abdominal discomfort, blood in stools or black stools.  No recent change in bowel habits.   GU:  No history of incontinence.   MUSCLOSKELETAL: No history of joint pain or swelling.  No myalgias.   SKIN: Negative for lesions, rash, and itching.   ENDOCRINE: Negative for cold or heat intolerance, polydipsia or goiter.   PSYCH:  No depression or anxiety symptoms.   NEURO: As Above.   Vital Signs:  BP 104/64 mmHg  Pulse 63  Ht 5\' 1"  (1.549 m)  Wt 151 lb 2 oz (68.55 kg)  BMI 28.57 kg/m2  SpO2 96%   Neurological Exam: MENTAL STATUS including orientation to time, place, person, recent and remote memory, attention span and concentration, language, and fund of knowledge is normal.  Speech is not dysarthric.  CRANIAL NERVES:   Pupils equal round and reactive to light.  Extraocular muscles intact (improved), mild esotropia of the left eye, no ptosis.  Orbicularis oculi and orbicularis oris are 5/5.  Buccinator is 5/5.  Tongue strength is 5/5.  MOTOR:  Motor strength is 5/5 in all extremities.  COORDINATION/GAIT:  Gait narrow based and stable.   Data: Lab Results  Component Value Date   ALT 16 12/18/2014   AST 21 12/18/2014   ALKPHOS 52 12/18/2014   BILITOT 0.5 12/18/2014   Lab Results  Component Value Date   WBC 8.4 12/18/2014   HGB 13.9  12/18/2014   HCT 41.8 12/18/2014   MCV 91.7 12/18/2014   PLT 235.0 12/18/2014    Myasthenia AChR binding, blocking, and modulating antibody 10/13/2013: Negative MRI brain 10/13/2013: Mild chronic microvascular ischemic change. No acute abnormality.   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 Right median neuropathy, at or distal to the wrist, consistent with the clinical diagnosis of carpal tunnel syndrome. Overall, these findings are mild to moderate in degree electrically.  CT chest 10/30/2013: No acute finding or finding to explain the patient's symptoms.  Diffuse fatty infiltration of the liver. Subtle nodularity of the liver border is compatible with cirrhosis.  Calcific aortic and coronary atherosclerosis.    IMPRESSION: Seronegative generalized myasthenia gravis (diagnosed 10/23/2013) Presenting  symptoms:  fatigue, diplopia, dysarthria, and dysphagia in August 2015 Clinically stable, without ocular findings  Given that she has been unable to taper prednisone < 15mg , she was started on steroid sparing agent, azathrioprine in June 2016 and has been able to slowly taper prednisone  Blurry vision due to cataracts, followed by opthalmology  Diabetes mellitus, followed by PCP   PLAN/RECOMMENDATIONS:  1.  Reduce prednisone 7.5mg  for 2 weeks, then alternate with 7.5mg  and 5mg  for two weeks, then reduce to 5mg  daily 2.  Continue mestinon to 60mg  to twice daily (10am and 5pm) 3.  Continue azathioprine 100mg  daily 4.  Check CBC and CMP every 3 months.  Labs reviewed and stable.  5.  Continue taking nexium and calcium supplements  Return to clinic in 3 months   The duration of this appointment visit was 25 minutes of face-to-face time with the patient.  Greater than 50% of this time was spent in counseling, explanation of diagnosis, planning of further management, and coordination of care.   Thank you  for allowing me to participate in patient's care.  If I can answer any additional questions, I would be pleased to do so.    Sincerely,    Dashiel Bergquist K. Posey Pronto, DO

## 2015-04-24 ENCOUNTER — Other Ambulatory Visit (INDEPENDENT_AMBULATORY_CARE_PROVIDER_SITE_OTHER): Payer: Commercial Managed Care - HMO

## 2015-04-24 DIAGNOSIS — G7000 Myasthenia gravis without (acute) exacerbation: Secondary | ICD-10-CM

## 2015-04-24 DIAGNOSIS — Z79899 Other long term (current) drug therapy: Secondary | ICD-10-CM

## 2015-04-24 DIAGNOSIS — Z5181 Encounter for therapeutic drug level monitoring: Secondary | ICD-10-CM

## 2015-04-24 LAB — COMPREHENSIVE METABOLIC PANEL
ALBUMIN: 4.4 g/dL (ref 3.5–5.2)
ALT: 15 U/L (ref 0–35)
AST: 18 U/L (ref 0–37)
Alkaline Phosphatase: 56 U/L (ref 39–117)
BILIRUBIN TOTAL: 0.4 mg/dL (ref 0.2–1.2)
BUN: 17 mg/dL (ref 6–23)
CALCIUM: 9.6 mg/dL (ref 8.4–10.5)
CHLORIDE: 98 meq/L (ref 96–112)
CO2: 33 mEq/L — ABNORMAL HIGH (ref 19–32)
CREATININE: 0.95 mg/dL (ref 0.40–1.20)
GFR: 62.67 mL/min (ref >60.00)
Glucose, Bld: 218 mg/dL — ABNORMAL HIGH (ref 70–99)
Potassium: 3.9 mEq/L (ref 3.5–5.1)
SODIUM: 136 meq/L (ref 135–145)
Total Protein: 7.1 g/dL (ref 6.0–8.3)

## 2015-04-24 LAB — CBC
HCT: 42.2 % (ref 36.0–46.0)
Hemoglobin: 14.2 g/dL (ref 12.0–15.0)
MCHC: 33.6 g/dL (ref 30.0–36.0)
MCV: 89.3 fl (ref 78.0–100.0)
PLATELETS: 238 10*3/uL (ref 150.0–400.0)
RBC: 4.73 Mil/uL (ref 3.87–5.11)
RDW: 13.5 % (ref 11.5–15.5)
WBC: 8.5 10*3/uL (ref 4.0–10.5)

## 2015-06-19 ENCOUNTER — Encounter: Payer: Self-pay | Admitting: Neurology

## 2015-06-19 ENCOUNTER — Ambulatory Visit (INDEPENDENT_AMBULATORY_CARE_PROVIDER_SITE_OTHER): Payer: Commercial Managed Care - HMO | Admitting: Neurology

## 2015-06-19 VITALS — BP 118/70 | HR 64 | Ht 61.0 in | Wt 149.1 lb

## 2015-06-19 DIAGNOSIS — G7000 Myasthenia gravis without (acute) exacerbation: Secondary | ICD-10-CM

## 2015-06-19 NOTE — Patient Instructions (Signed)
1.  Reduce prednisone to 5mg  on even days and 2.5mg  on odd days for a month.  If you are doing okay after a month, then reduce to 2.5mg  daily until I see you again. 2.  Take mestinon at 10am only.  If needed, you can take an extra dose at 4pm. 3.  Continue azathioprine 100mg  daily 4.  Return to clinic in 2 months

## 2015-06-19 NOTE — Progress Notes (Signed)
Follow-up Visit   Date: 06/19/2015   Cassidy Carlson MRN: [id number] DOB: [birth year]   Interim History: Cassidy Carlson is a 65 y.o. right-handed Bengali female with history of diabetes mellitus (diagnosed 2014), hyperlipidemia, GERD and hypertension returning for follow-up of seronegative myasthenia gravis.  She is accompanied by her husband.   History of present illness: Starting on 10/12/2013, she felt weakness of the tongue, double vision, difficulty keeping her eyes, and speech difficulty. She went to the emergency department where she was admitted overnight and had MRI brain which did not show acute stroke. Labs including CK and ACRh antibodies (binding, blocking, and modulating) were nondiagnostic and she was recommended to follow up with neurology for further evaluation.  She developed sluured speech, generalized fatigue, and double vision, described images on top of each other. Of late, she has started to sleep on 4 pillows because laying flat makes her short of breath.  She has no weakness of her arms and legs, but does describe generalized fatigue.  In September, she underwent EMG which showed post-synaptic NMJ disorder consistent with MG.  She started mestinon 60mg  and prednisone 10mg  daily and noticed improvement of her voice and vision. Because of mild dysphagia, prednisone was increased to 15mg  daily but due to GERD she only did this for 10 days and self-tapered back to 10mg  daily.   UPDATE 07/24/2014:  She complains of achy pain involving her right leg, which has been ongoing for sometime.  It is worse with prolonged walking for 15 minutes.  It starts in the lower portion of her leg and radiation up her thigh and low back.  She missed her appointment with her primary care provider.   Regarding her myasthenia, symptoms are stable without any worsening of diplopia, slurred speech, or dysarthria. She is taking prednisone 15mg  daily and mestinon 60mg  twice daily.  Imuran 100mg  started.    UPDATE 11/13/2014:  She reports doing fair, some days are better than other.  Blurry vision remains persistent, which I have stressed is not due to her MG, she has not had her eyes checked.  No swallowing or slurred speech.  At her last visit, she was instructed to taper prednisone to 15/10 on alternating days, but she did not do this and remains on prednisone 15mg  daily.   UPDATE 12/31/2014:  She has been successfully able to taper prednisone to 10mg  daily.    Her blurry vision is also better and only intermittent now.    UPDATE 04/20/2015:  She is taking prednisone 10mg  alternating with 7.5mg  and feels well.  She did not reduce it further to 7.5mg  daily.  She was told she has cataracts and could have surgery when she was ready.  She has several questions about whether she to have cataracts surgery, but requested that she discuss this with her ophthalmologist.  Patient and husband both feel that Imuran, which was started in June, has helped.   UPDATE 06/19/2015:  No new complaints, she has been able to taper her prednisone to 5mg  daily without any difficulty.   Medications:  Current Outpatient Prescriptions on File Prior to Visit  Medication Sig Dispense Refill  . aspirin 81 MG tablet Take 1 tablet (81 mg total) by mouth daily. 60 tablet 2  . escitalopram (LEXAPRO) 5 MG tablet     . esomeprazole (NEXIUM) 40 MG capsule Take 40 mg by mouth daily at 12 noon.    Marland Kitchen FARXIGA 5 MG TABS tablet     . metFORMIN (GLUCOPHAGE) 500  MG tablet     . metoprolol succinate (TOPROL-XL) 50 MG 24 hr tablet Take 50 mg by mouth daily. Take with or immediately following a meal.    . ONETOUCH VERIO test strip     . predniSONE (DELTASONE) 5 MG tablet Take 1.5 tablets daily for two weeks, then alternate with 1.5 tablets and 1 tablet for two weeks, then reduce 1 tablets daily. 150 tablet 3  . pyridostigmine (MESTINON) 60 MG tablet One tablet twice daily (10am and 5pm). 180 tablet 3  . ranitidine (ZANTAC) 75 MG tablet Take  150 mg by mouth daily as needed for heartburn.    . rosuvastatin (CRESTOR) 10 MG tablet Take 10 mg by mouth daily.    . sitaGLIPtin (JANUVIA) 100 MG tablet Take 100 mg by mouth daily.    . THIAMINE HCL PO Take 1 tablet by mouth daily.    Jabier Gauss 40-10-25 MG TABS      No current facility-administered medications on file prior to visit.    Allergies:  Allergies  Allergen Reactions  . Sulfa Antibiotics Rash  . Pork-Derived Products      Review of Systems:  CONSTITUTIONAL: No fevers, chills, night sweats, or weight loss.   EYES: +visual changes or eye pain + tearing ENT: No hearing changes.  No history of nose bleeds.   RESPIRATORY: No cough, wheezing +shortness of breath.   CARDIOVASCULAR: Negative for chest pain, and palpitations.   GI: Negative for abdominal discomfort, blood in stools or black stools.  No recent change in bowel habits.   GU:  No history of incontinence.   MUSCLOSKELETAL: No history of joint pain or swelling.  No myalgias.   SKIN: Negative for lesions, rash, and itching.   ENDOCRINE: Negative for cold or heat intolerance, polydipsia or goiter.   PSYCH:  No depression or anxiety symptoms.   NEURO: As Above.   Vital Signs:  BP 118/70 mmHg  Pulse 64  Ht 5\' 1"  (1.549 m)  Wt 149 lb 1 oz (67.614 kg)  BMI 28.18 kg/m2  SpO2 92%   Neurological Exam: MENTAL STATUS including orientation to time, place, person, recent and remote memory, attention span and concentration, language, and fund of knowledge is normal.  Speech is not dysarthric.  CRANIAL NERVES:   Pupils equal round and reactive to light.  Extraocular muscles intact, mild esotropia of the left eye, no ptosis.  Orbicularis oculi and orbicularis oris are 5/5.  Buccinator is 5/5.  Tongue strength is 5/5.  MOTOR:  Motor strength is 5/5 in all extremities.  COORDINATION/GAIT:  Gait narrow based and stable.   Data: Lab Results  Component Value Date   ALT 15 04/24/2015   AST 18 04/24/2015   ALKPHOS 56  04/24/2015   BILITOT 0.4 04/24/2015   Lab Results  Component Value Date   WBC 8.5 04/24/2015   HGB 14.2 04/24/2015   HCT 42.2 04/24/2015   MCV 89.3 04/24/2015   PLT 238.0 04/24/2015    Myasthenia AChR binding, blocking, and modulating antibody 10/13/2013: Negative MRI brain 10/13/2013: Mild chronic microvascular ischemic change. No acute abnormality.   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 Right median neuropathy, at or distal to the wrist, consistent with the clinical diagnosis of carpal tunnel syndrome. Overall, these findings are mild to moderate in degree electrically.  CT chest 10/30/2013: No acute finding or finding to explain the patient's symptoms.  Diffuse fatty infiltration of the liver. Subtle nodularity of the  liver border is compatible with cirrhosis.  Calcific aortic and coronary atherosclerosis.    IMPRESSION: Seronegative generalized myasthenia gravis (diagnosed 10/23/2013) Presenting symptoms:  fatigue, diplopia, dysarthria, and dysphagia in August 2015 Clinically stable, without ocular findings  Given that she has been unable to taper prednisone < 15mg , she was started on steroid sparing agent, azathrioprine in June 2016 and has been able to slowly taper prednisone  Blurry vision due to cataracts, followed by opthalmology  Diabetes mellitus, followed by PCP   PLAN/RECOMMENDATIONS:  1.  Reduce prednisone 5mg  alternating with 2.5mg  daily for a month, then reduce to 2.5mg  daily. 2.  Reduce mestinon to 60mg  at 10AM and okay to take extra dose daily as needed 3.  Continue azathioprine 100mg  daily 4.  Check CBC and CMP every 3 months.  Labs reviewed and stable.  5.  Continue taking nexium and calcium supplements  Return to clinic in 2 months   The duration of this appointment visit was 25 minutes of face-to-face time with the patient.  Greater than 50% of this time was spent in  counseling, explanation of diagnosis, planning of further management, and coordination of care.   Thank you for allowing me to participate in patient's care.  If I can answer any additional questions, I would be pleased to do so.    Sincerely,    Ajah Vanhoose K. Posey Pronto, DO

## 2015-08-28 ENCOUNTER — Other Ambulatory Visit (INDEPENDENT_AMBULATORY_CARE_PROVIDER_SITE_OTHER): Payer: Commercial Managed Care - HMO

## 2015-08-28 ENCOUNTER — Ambulatory Visit (INDEPENDENT_AMBULATORY_CARE_PROVIDER_SITE_OTHER): Payer: Commercial Managed Care - HMO | Admitting: Neurology

## 2015-08-28 ENCOUNTER — Encounter: Payer: Self-pay | Admitting: Neurology

## 2015-08-28 VITALS — BP 120/70 | HR 65 | Ht 61.0 in | Wt 146.2 lb

## 2015-08-28 DIAGNOSIS — G7000 Myasthenia gravis without (acute) exacerbation: Secondary | ICD-10-CM | POA: Diagnosis not present

## 2015-08-28 DIAGNOSIS — Z5181 Encounter for therapeutic drug level monitoring: Secondary | ICD-10-CM | POA: Diagnosis not present

## 2015-08-28 LAB — COMPREHENSIVE METABOLIC PANEL
ALBUMIN: 4.1 g/dL (ref 3.5–5.2)
ALT: 10 U/L (ref 0–35)
AST: 14 U/L (ref 0–37)
Alkaline Phosphatase: 61 U/L (ref 39–117)
BUN: 18 mg/dL (ref 6–23)
CALCIUM: 9.8 mg/dL (ref 8.4–10.5)
CHLORIDE: 99 meq/L (ref 96–112)
CO2: 34 meq/L — AB (ref 19–32)
Creatinine, Ser: 0.97 mg/dL (ref 0.40–1.20)
GFR: 61.12 mL/min (ref >60.00)
Glucose, Bld: 142 mg/dL — ABNORMAL HIGH (ref 70–99)
POTASSIUM: 4 meq/L (ref 3.5–5.1)
Sodium: 137 mEq/L (ref 135–145)
Total Bilirubin: 0.5 mg/dL (ref 0.2–1.2)
Total Protein: 7.1 g/dL (ref 6.0–8.3)

## 2015-08-28 LAB — CBC
HEMATOCRIT: 39.1 % (ref 36.0–46.0)
Hemoglobin: 13.5 g/dL (ref 12.0–15.0)
MCHC: 34.7 g/dL (ref 30.0–36.0)
MCV: 88.3 fl (ref 78.0–100.0)
PLATELETS: 227 10*3/uL (ref 150.0–400.0)
RBC: 4.42 Mil/uL (ref 3.87–5.11)
RDW: 13 % (ref 11.5–15.5)
WBC: 7.9 10*3/uL (ref 4.0–10.5)

## 2015-08-28 MED ORDER — PREDNISONE 5 MG PO TABS: ORAL_TABLET | ORAL | Status: DC

## 2015-08-28 NOTE — Patient Instructions (Addendum)
1.  Reduce prednisone 2.5mg  daily for a month, then reduce to 2.5mg  Monday-Wednesday-Friday and continue this until I see you again 2.  Continue mestinon to 60mg  at 10AM and okay to take extra dose daily as needed 3.  Continue azathioprine 100mg  daily 4.  Check CBC and CMP   Return to clinic in 3 months

## 2015-08-28 NOTE — Progress Notes (Signed)
Note routed

## 2015-08-28 NOTE — Progress Notes (Signed)
Follow-up Visit   Date: 08/28/2015   Alexyss Bourque MRN: [id number] DOB: 09-25-1950   Interim History: Cassidy Carlson is a 65 y.o. right-handed Bengali female with history of diabetes mellitus (diagnosed 2014), hyperlipidemia, GERD and hypertension returning for follow-up of seronegative myasthenia gravis.  She is accompanied by her husband.   History of present illness: Starting on 10/12/2013, she felt weakness of the tongue, double vision, difficulty keeping her eyes, and speech difficulty. She went to the emergency department where she was admitted overnight and had MRI brain which did not show acute stroke. Labs including CK and ACRh antibodies (binding, blocking, and modulating) were nondiagnostic and she was recommended to follow up with neurology for further evaluation.  She developed sluured speech, generalized fatigue, and double vision, described images on top of each other. Of late, she has started to sleep on 4 pillows because laying flat makes her short of breath.  She has no weakness of her arms and legs, but does describe generalized fatigue.  In September, she underwent EMG which showed post-synaptic NMJ disorder consistent with MG.  She started mestinon 60mg  and prednisone 10mg  daily and noticed improvement of her voice and vision. Because of mild dysphagia, prednisone was increased to 15mg  daily but due to GERD she only did this for 10 days and self-tapered back to 10mg  daily.   In June 2016, Imuran 100mg  was started because she was unable to taper her prednisone less than 15mg  daily.  She has been on a very slow prednisone taper since this time and has been tolerating it well.  UPDATE 08/28/2015:  No new complaints, she has been able to taper her prednisone to 5mg  alternating with 2.5mg .  She arrived back from [place] because her mother passed away and reports that she did well, despite being in hot temperatures.  She feels tired recently and her husband says this is due to  jet lag.    Medications:  Current Outpatient Prescriptions on File Prior to Visit  Medication Sig Dispense Refill  . aspirin 81 MG tablet Take 1 tablet (81 mg total) by mouth daily. 60 tablet 2  . azaTHIOprine (IMURAN) 50 MG tablet     . escitalopram (LEXAPRO) 5 MG tablet     . esomeprazole (NEXIUM) 40 MG capsule Take 40 mg by mouth daily at 12 noon.    Marland Kitchen FARXIGA 5 MG TABS tablet     . metFORMIN (GLUCOPHAGE) 500 MG tablet     . metoprolol succinate (TOPROL-XL) 50 MG 24 hr tablet Take 50 mg by mouth daily. Take with or immediately following a meal.    . ONETOUCH VERIO test strip     . pyridostigmine (MESTINON) 60 MG tablet One tablet twice daily (10am and 5pm). 180 tablet 3  . ranitidine (ZANTAC) 75 MG tablet Take 150 mg by mouth daily as needed for heartburn.    . rosuvastatin (CRESTOR) 10 MG tablet Take 10 mg by mouth daily.    . sitaGLIPtin (JANUVIA) 100 MG tablet Take 100 mg by mouth daily.    . THIAMINE HCL PO Take 1 tablet by mouth daily.    Jabier Gauss 40-10-25 MG TABS      No current facility-administered medications on file prior to visit.    Allergies:  Allergies  Allergen Reactions  . Sulfa Antibiotics Rash  . Pork-Derived Products      Review of Systems:  CONSTITUTIONAL: No fevers, chills, night sweats, or weight loss.   EYES: +visual changes or eye pain +  tearing ENT: No hearing changes.  No history of nose bleeds.   RESPIRATORY: No cough, wheezing +shortness of breath.   CARDIOVASCULAR: Negative for chest pain, and palpitations.   GI: Negative for abdominal discomfort, blood in stools or black stools.  No recent change in bowel habits.   GU:  No history of incontinence.   MUSCLOSKELETAL: No history of joint pain or swelling.  No myalgias.   SKIN: Negative for lesions, rash, and itching.   ENDOCRINE: Negative for cold or heat intolerance, polydipsia or goiter.   PSYCH:  No depression or anxiety symptoms.   NEURO: As Above.   Vital Signs:  BP 120/70 mmHg   Pulse 65  Ht 5\' 1"  (1.549 m)  Wt 146 lb 3 oz (66.31 kg)  BMI 27.64 kg/m2  SpO2 95%   Neurological Exam: MENTAL STATUS including orientation to time, place, person, recent and remote memory, attention span and concentration, language, and fund of knowledge is normal.  Speech is not dysarthric.  CRANIAL NERVES:   Pupils equal round and reactive to light.  Extraocular muscles intact, mild esotropia of the left eye, no ptosis.  Orbicularis oculi and orbicularis oris are 5/5.  Buccinator is 5/5.  Tongue strength is 5/5.  MOTOR:  Motor strength is 5/5 in all extremities.  REFLEXES:  Brisk and symmetric throughout 2+/4  COORDINATION/GAIT:  Gait narrow based and stable.   Data: Lab Results  Component Value Date   ALT 15 04/24/2015   AST 18 04/24/2015   ALKPHOS 56 04/24/2015   BILITOT 0.4 04/24/2015   Lab Results  Component Value Date   WBC 8.5 04/24/2015   HGB 14.2 04/24/2015   HCT 42.2 04/24/2015   MCV 89.3 04/24/2015   PLT 238.0 04/24/2015    Myasthenia AChR binding, blocking, and modulating antibody 10/13/2013: Negative MRI brain 10/13/2013: Mild chronic microvascular ischemic change. No acute abnormality.   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 Right median neuropathy, at or distal to the wrist, consistent with the clinical diagnosis of carpal tunnel syndrome. Overall, these findings are mild to moderate in degree electrically.  CT chest 10/30/2013: No acute finding or finding to explain the patient's symptoms.  Diffuse fatty infiltration of the liver. Subtle nodularity of the liver border is compatible with cirrhosis.  Calcific aortic and coronary atherosclerosis.    IMPRESSION: Seronegative generalized myasthenia gravis (diagnosed 10/23/2013) Presenting symptoms:  fatigue, diplopia, dysarthria, and dysphagia in August 2015 Clinically stable, without evidence of bulbar weakness  Given  that she has been unable to taper prednisone < 15mg , she was started on azathrioprine in June 2016 and has been able to slowly taper prednisone  Blurry vision due to cataracts, followed by opthalmology  Diabetes mellitus, followed by PCP   PLAN/RECOMMENDATIONS:  1.  Reduce prednisone 2.5mg  daily for a month, then reduce to 2.5mg  MWF 2.  Continue mestinon to 60mg  at 10AM and okay to take extra dose daily as needed 3.  Continue azathioprine 100mg  daily 4.  Check CBC and CMP every 3 months.  Labs reviewed and stable.  5.  Continue taking nexium and calcium supplements  Return to clinic in 3 months   The duration of this appointment visit was 25 minutes of face-to-face time with the patient.  Greater than 50% of this time was spent in counseling, explanation of diagnosis, planning of further management, and coordination of care.   Thank you for allowing me to participate in patient's care.  If I  can answer any additional questions, I would be pleased to do so.    Sincerely,    Donika K. Posey Pronto, DO

## 2015-12-04 ENCOUNTER — Ambulatory Visit: Payer: Commercial Managed Care - HMO | Admitting: Neurology

## 2015-12-07 ENCOUNTER — Telehealth: Payer: Self-pay | Admitting: Neurology

## 2015-12-07 NOTE — Telephone Encounter (Signed)
Rx faxed in for 90 day supply.

## 2015-12-07 NOTE — Telephone Encounter (Signed)
Pt needs RX called in for azathioprine to [facility]. She is out.

## 2016-01-07 ENCOUNTER — Telehealth: Payer: Self-pay | Admitting: *Deleted

## 2016-01-07 NOTE — Telephone Encounter (Signed)
Patient called to ask how she is supposed to be taking her prednisone.  I read to her what your last note said but she still wanted me to check with you.  Please advise.

## 2016-01-07 NOTE — Telephone Encounter (Signed)
Patient given instructions per Dr. Posey Pronto.  She will call if not doing well on the 3 days a week trial.

## 2016-01-07 NOTE — Telephone Encounter (Signed)
She should be taking prednisone 2.5mg  MWF - if she has been stable on this, she can reduce to prednisone 2.5mg  Monday and Thursday only (twice per week) until she sees me in January.  Donika K. Posey Pronto, DO

## 2016-01-29 ENCOUNTER — Telehealth: Payer: Self-pay | Admitting: Neurology

## 2016-01-29 NOTE — Telephone Encounter (Signed)
Labs rec'd dated 01/28/2016:  Na 137, K 4.5, Chl 101, AST 20, ALT 15, Alk phos 77, WBC 5.9, Hgb 14, PLT 230  Recheck in 3 months.

## 2016-02-01 ENCOUNTER — Other Ambulatory Visit: Payer: Self-pay | Admitting: Neurology

## 2016-02-01 ENCOUNTER — Other Ambulatory Visit: Payer: Self-pay | Admitting: *Deleted

## 2016-02-01 ENCOUNTER — Telehealth: Payer: Self-pay | Admitting: Neurology

## 2016-02-01 MED ORDER — AZATHIOPRINE 50 MG PO TABS: 100.0000 mg | ORAL_TABLET | Freq: Every day | ORAL | 3 refills | Status: DC

## 2016-02-01 NOTE — Telephone Encounter (Signed)
Cassidy Carlson 05-09-2050. Her # is [phone] or [phone]. She will be leaving to go out of the country soon and would like her prescriptions sent to [facility] on [address] in [place]. Their # [id number]. Thank you

## 2016-02-01 NOTE — Telephone Encounter (Signed)
Patient notified that Rx was called in.

## 2016-02-10 ENCOUNTER — Telehealth: Payer: Self-pay | Admitting: Neurology

## 2016-02-10 NOTE — Telephone Encounter (Signed)
PT called and wanted a call back in regards to faxing her prescription because she is going out of the country/[place], she said she will be available to talk tomorrow after 3pm CB# [phone]

## 2016-02-11 NOTE — Telephone Encounter (Signed)
Left message for patient to call me back. 

## 2016-02-29 ENCOUNTER — Ambulatory Visit: Payer: Commercial Managed Care - HMO | Admitting: Neurology

## 2016-02-29 NOTE — Progress Notes (Deleted)
Follow-up Visit   Date: 02/29/16   Cassidy Carlson MRN: [id number] DOB: 1950-05-03   Interim History: Cassidy Carlson is a 67 y.o. right-handed Bengali female with history of diabetes mellitus (diagnosed 2014), hyperlipidemia, GERD and hypertension returning for follow-up of seronegative myasthenia gravis.  She is accompanied by her husband.   History of present illness: Starting on 10/12/2013, she felt weakness of the tongue, double vision, difficulty keeping her eyes, and speech difficulty. She went to the emergency department where she was admitted overnight and had MRI brain which did not show acute stroke. Labs including CK and ACRh antibodies (binding, blocking, and modulating) were nondiagnostic and she was recommended to follow up with neurology for further evaluation.  She developed sluured speech, generalized fatigue, and double vision, described images on top of each other. Of late, she has started to sleep on 4 pillows because laying flat makes her short of breath.  She has no weakness of her arms and legs, but does describe generalized fatigue.  In September, she underwent EMG which showed post-synaptic NMJ disorder consistent with MG.  She started mestinon 17m and prednisone 168mdaily and noticed improvement of her voice and vision. Because of mild dysphagia, prednisone was increased to 1548maily but due to GERD she only did this for 10 days and self-tapered back to 60m107mily.   In June 2016, Imuran 100mg59m started because she was unable to taper her prednisone less than 15mg 19my.  She has been on a very slow prednisone taper since this time and has been tolerating it well.  UPDATE 08/28/2015:  No new complaints, she has been able to taper her prednisone to 5mg al77mnating with 2.5mg.  S[place] back from [place] mother passed away and reports that she did well, despite being in hot temperatures.  She feels tired recently and her husband says this is due to  jet lag.    UPDATE 02/29/2016:  ***  Medications:  Current Outpatient Prescriptions on File Prior to Visit  Medication Sig Dispense Refill  . aspirin 81 MG tablet Take 1 tablet (81 mg total) by mouth daily. 60 tablet 2  . azaTHIOprine (IMURAN) 50 MG tablet Take 2 tablets (100 mg total) by mouth daily. 180 tablet 3  . escitalopram (LEXAPRO) 5 MG tablet     . esomeprazole (NEXIUM) 40 MG capsule Take 40 mg by mouth daily at 12 noon.    . FARXIGMarland Kitchen 5 MG TABS tablet     . metFORMIN (GLUCOPHAGE) 500 MG tablet     . metoprolol succinate (TOPROL-XL) 50 MG 24 hr tablet Take 50 mg by mouth daily. Take with or immediately following a meal.    . ONETOUCH VERIO test strip     . predniSONE (DELTASONE) 5 MG tablet Take 2.5mg (hal30mab) daily for a month, then reduce 2.5mg on Mo71my-Wednesday-Friday. 45 tablet 3  . pyridostigmine (MESTINON) 60 MG tablet TAKE ONE TABLET BY MOUTH TWICE DAILY AT 10 AM AND 5 PM. 60 tablet 11  . ranitidine (ZANTAC) 75 MG tablet Take 150 mg by mouth daily as needed for heartburn.    . rosuvastatin (CRESTOR) 10 MG tablet Take 10 mg by mouth daily.    . sitaGLIPtin (JANUVIA) 100 MG tablet Take 100 mg by mouth daily.    . THIAMINE HCL PO Take 1 tablet by mouth daily.    . TRIBENZOJabier GaussMG TABS      No current facility-administered medications on file prior to visit.  Allergies:  Allergies  Allergen Reactions  . Sulfa Antibiotics Rash  . Pork-Derived Products      Review of Systems:  CONSTITUTIONAL: No fevers, chills, night sweats, or weight loss.   EYES: +visual changes or eye pain + tearing ENT: No hearing changes.  No history of nose bleeds.   RESPIRATORY: No cough, wheezing +shortness of breath.   CARDIOVASCULAR: Negative for chest pain, and palpitations.   GI: Negative for abdominal discomfort, blood in stools or black stools.  No recent change in bowel habits.   GU:  No history of incontinence.   MUSCLOSKELETAL: No history of joint pain or swelling.  No  myalgias.   SKIN: Negative for lesions, rash, and itching.   ENDOCRINE: Negative for cold or heat intolerance, polydipsia or goiter.   PSYCH:  No depression or anxiety symptoms.   NEURO: As Above.   Vital Signs:  There were no vitals taken for this visit.   Neurological Exam: MENTAL STATUS including orientation to time, place, person, recent and remote memory, attention span and concentration, language, and fund of knowledge is normal.  Speech is not dysarthric.  CRANIAL NERVES:   Pupils equal round and reactive to light.  Extraocular muscles intact, mild esotropia of the left eye, no ptosis.  Orbicularis oculi and orbicularis oris are 5/5.  Buccinator is 5/5.  Tongue strength is 5/5.  MOTOR:  Motor strength is 5/5 in all extremities.  REFLEXES:  Brisk and symmetric throughout 2+/4  COORDINATION/GAIT:  Gait narrow based and stable.   Data: Lab Results  Component Value Date   ALT 10 08/28/2015   AST 14 08/28/2015   ALKPHOS 61 08/28/2015   BILITOT 0.5 08/28/2015   Lab Results  Component Value Date   WBC 7.9 08/28/2015   HGB 13.5 08/28/2015   HCT 39.1 08/28/2015   MCV 88.3 08/28/2015   PLT 227.0 08/28/2015    Myasthenia AChR binding, blocking, and modulating antibody 10/13/2013: Negative MRI brain 10/13/2013: Mild chronic microvascular ischemic change. No acute abnormality.   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 Right median neuropathy, at or distal to the wrist, consistent with the clinical diagnosis of carpal tunnel syndrome. Overall, these findings are mild to moderate in degree electrically.  CT chest 10/30/2013: No acute finding or finding to explain the patient's symptoms.  Diffuse fatty infiltration of the liver. Subtle nodularity of the liver border is compatible with cirrhosis.  Calcific aortic and coronary atherosclerosis.   Labs rec'd dated 01/28/2016:  Na 137, K 4.5, Chl 101,  AST 20, ALT 15, Alk phos 77, WBC 5.9, Hgb 14, PLT 230  IMPRESSION: Seronegative generalized myasthenia gravis (diagnosed 10/23/2013) Presenting symptoms:  fatigue, diplopia, dysarthria, and dysphagia in August 2015 Clinically stable, without evidence of bulbar weakness  Given that she has been unable to taper prednisone < 22m, she was started on azathrioprine in June 2016 and has been able to slowly taper prednisone  Blurry vision due to cataracts, followed by opthalmology  Diabetes mellitus, followed by PCP   PLAN/RECOMMENDATIONS:  1.  Reduce prednisone 1106mMonday and Thursday x 2 weeks, then Monday only x 2 weeks, the stop.  2.  Continue mestinon to 6040mt 10AM and okay to take extra dose daily as needed 3.  Continue azathioprine 100m26mily 4.  Check CBC and CMP every 3 months.  Labs reviewed and stable.  5.  Continue taking nexium and calcium supplements  Return to clinic in 6 months  The duration of this appointment visit was 25 minutes of face-to-face time with the patient.  Greater than 50% of this time was spent in counseling, explanation of diagnosis, planning of further management, and coordination of care.   Thank you for allowing me to participate in patient's care.  If I can answer any additional questions, I would be pleased to do so.    Sincerely,    Laneice Meneely K. Posey Pronto, DO

## 2016-03-07 ENCOUNTER — Encounter: Payer: Self-pay | Admitting: Neurology

## 2016-03-15 ENCOUNTER — Telehealth: Payer: Self-pay | Admitting: *Deleted

## 2016-03-15 NOTE — Telephone Encounter (Signed)
Patient is complaining about something floating around in her eyes.  Is this from the MG?

## 2016-03-15 NOTE — Telephone Encounter (Signed)
No, myasthenia would not cause this.  Myasthenia causes double vision and droopy eyes, but not visual changes like she mentions.  She should see her eye doctor for this.  Donika K. Posey Pronto, DO

## 2016-03-16 NOTE — Telephone Encounter (Signed)
Left message giving patient instructions per Dr. Posey Pronto.

## 2016-03-17 ENCOUNTER — Other Ambulatory Visit: Payer: Self-pay | Admitting: *Deleted

## 2016-03-17 MED ORDER — AZATHIOPRINE 50 MG PO TABS: 100.0000 mg | ORAL_TABLET | Freq: Every day | ORAL | 3 refills | Status: DC

## 2016-03-17 MED ORDER — PYRIDOSTIGMINE BROMIDE 60 MG PO TABS: ORAL_TABLET | ORAL | 3 refills | Status: DC

## 2016-03-22 ENCOUNTER — Telehealth: Payer: Self-pay | Admitting: Neurology

## 2016-03-22 NOTE — Telephone Encounter (Signed)
Patient needs to talk to someone about medication auth please call [phone]

## 2016-03-22 NOTE — Telephone Encounter (Signed)
Request for PA sent.

## 2016-03-24 DIAGNOSIS — H25813 Combined forms of age-related cataract, bilateral: Secondary | ICD-10-CM | POA: Diagnosis not present

## 2016-03-24 DIAGNOSIS — E119 Type 2 diabetes mellitus without complications: Secondary | ICD-10-CM | POA: Diagnosis not present

## 2016-03-30 ENCOUNTER — Ambulatory Visit: Payer: Commercial Managed Care - HMO | Admitting: Neurology

## 2016-04-13 DIAGNOSIS — M859 Disorder of bone density and structure, unspecified: Secondary | ICD-10-CM | POA: Diagnosis not present

## 2016-04-18 DIAGNOSIS — H2512 Age-related nuclear cataract, left eye: Secondary | ICD-10-CM | POA: Diagnosis not present

## 2016-04-18 DIAGNOSIS — H25012 Cortical age-related cataract, left eye: Secondary | ICD-10-CM | POA: Diagnosis not present

## 2016-04-18 DIAGNOSIS — H25812 Combined forms of age-related cataract, left eye: Secondary | ICD-10-CM | POA: Diagnosis not present

## 2016-05-09 IMAGING — CT CT CHEST W/ CM
2 of 3 series · 15 of 36 positions shown, 18 images · IV contrast (APPLIED)
Comparison: None.

CLINICAL DATA: Shortness of breath and cough.

EXAM:
CT CHEST WITH CONTRAST
TECHNIQUE: Multidetector CT imaging of the chest was performed during
intravenous contrast administration.
CONTRAST:  75 mL OMNIPAQUE IOHEXOL 300 MG/ML  SOLN

[Series 2: thorax 5.0 i31f 1 · axial · 0.59mm/px · z∈[+1042,+1262]mm · 12 of 52 slices shown, 15 images]
[im 4/52  mediastinal]
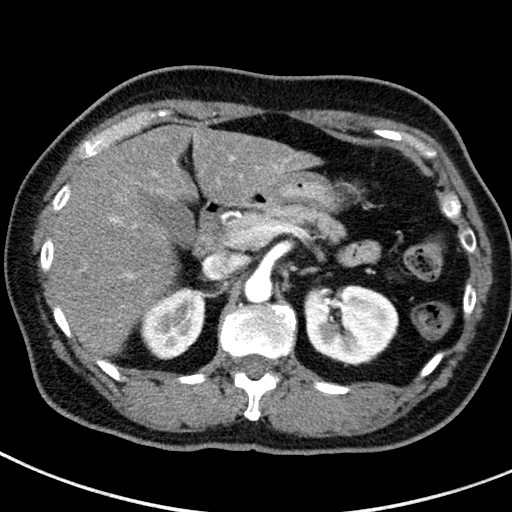
[im 4/52  lung]
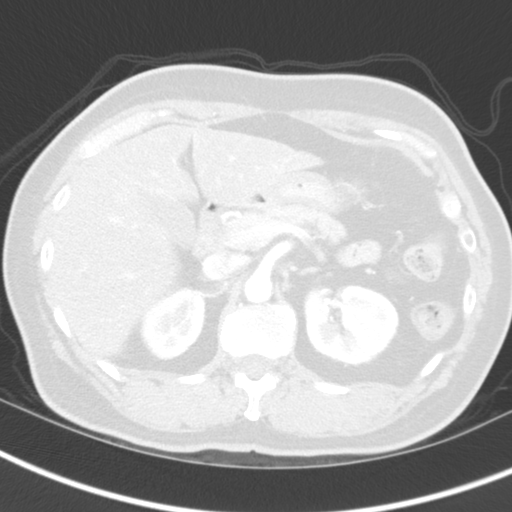
[im 8/52  lung]
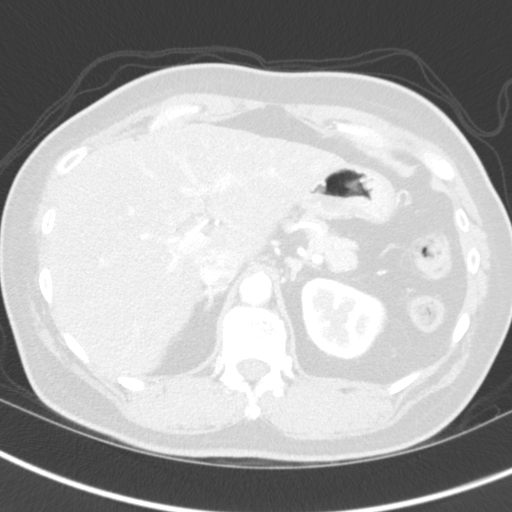
[im 12/52  lung]
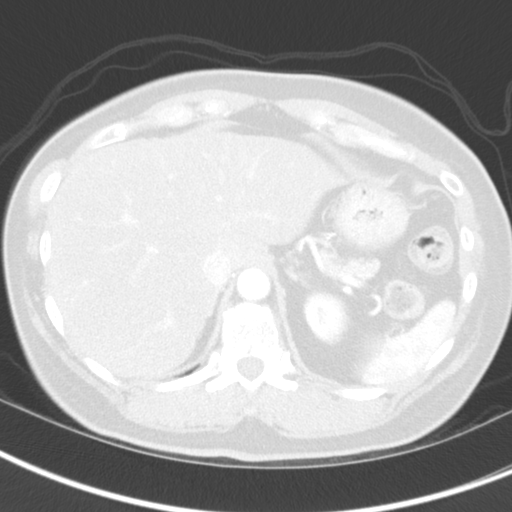
[im 16/52  lung]
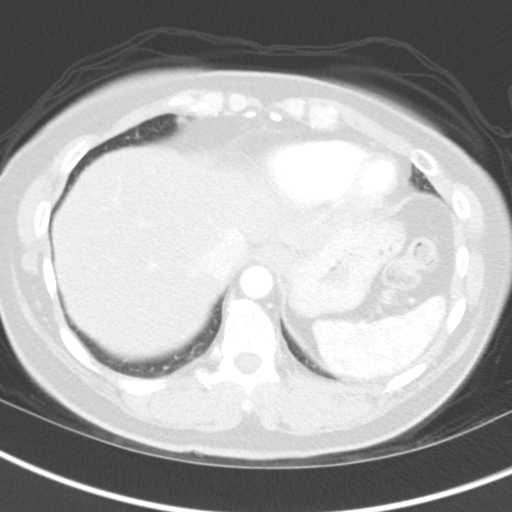
[im 19/52  mediastinal]
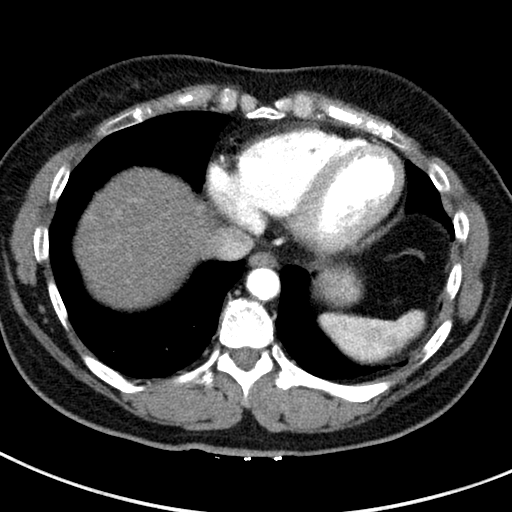
[im 19/52  lung]
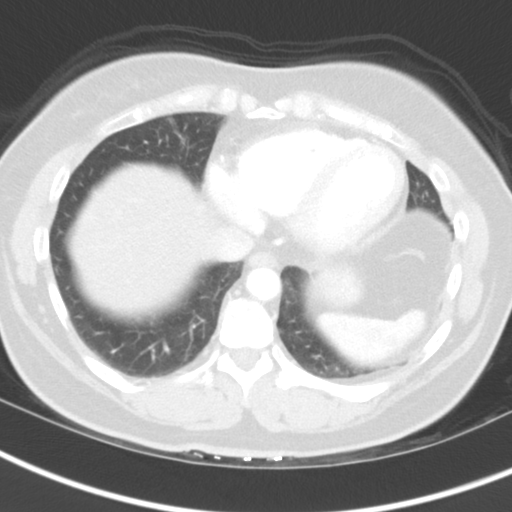
[im 23/52  lung]
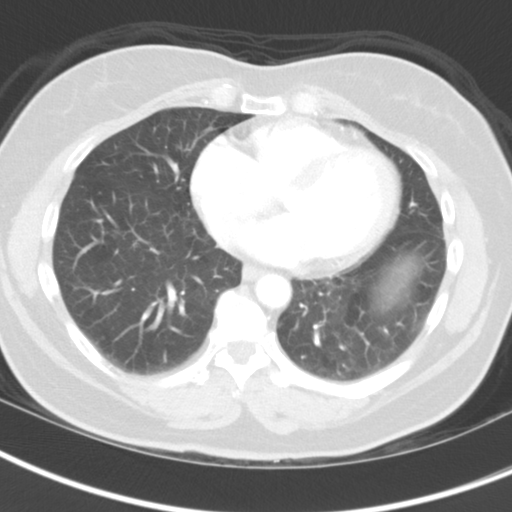
[im 29/52  lung]
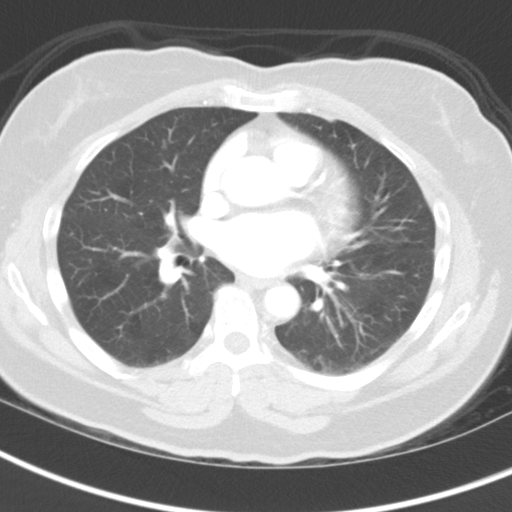
[im 33/52  lung]
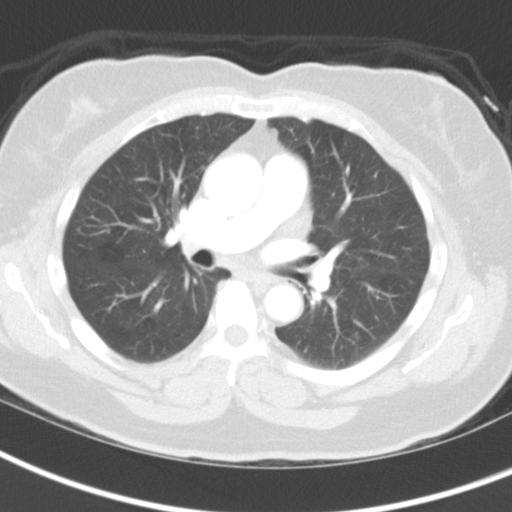
[im 36/52  mediastinal]
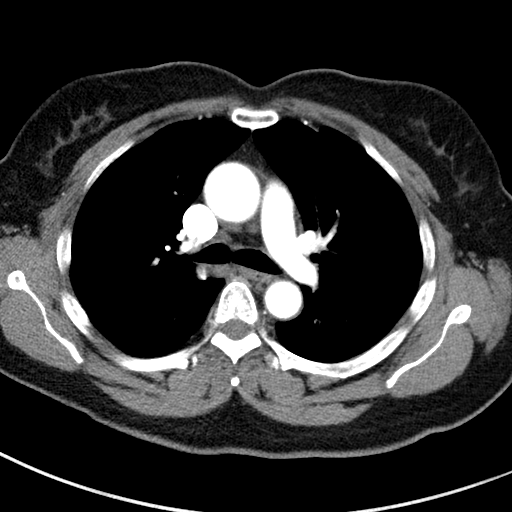
[im 36/52  lung]
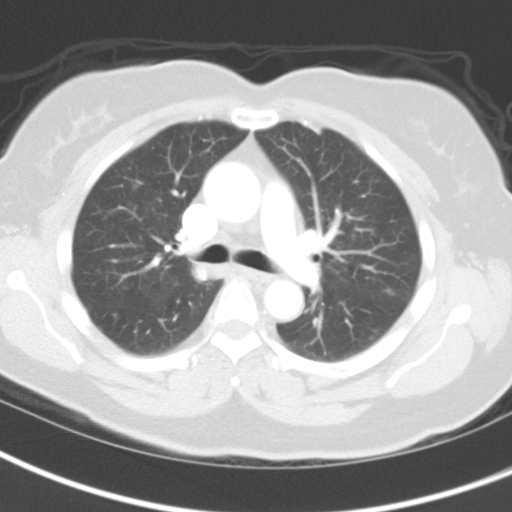
[im 40/52  lung]
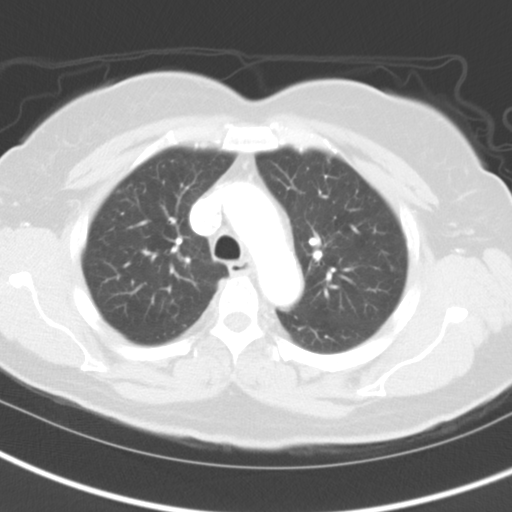
[im 44/52  lung]
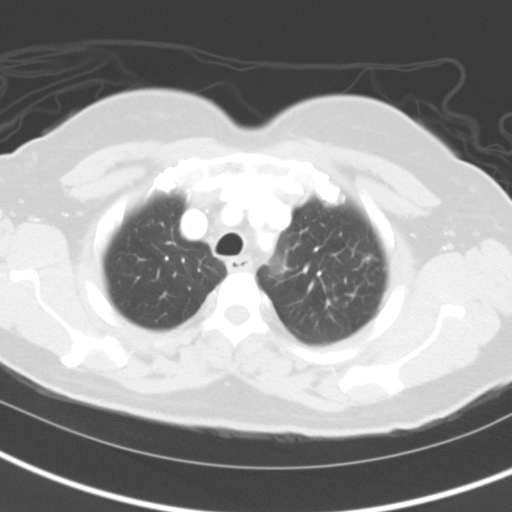
[im 48/52  lung]
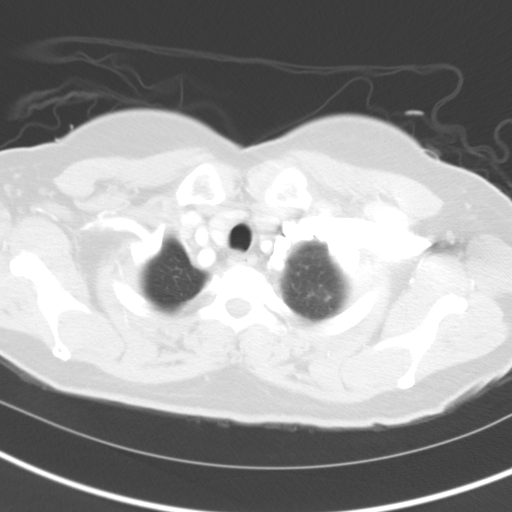

[Series 5: coronal · coronal · 0.51mm/px · 3 of 71 slices shown]
[im 15/71  lung]
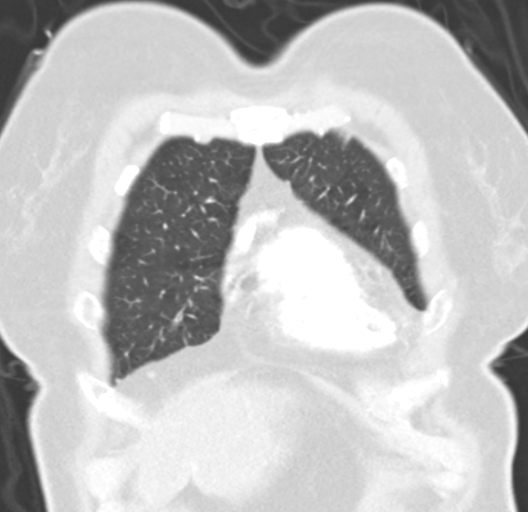
[im 29/71  lung]
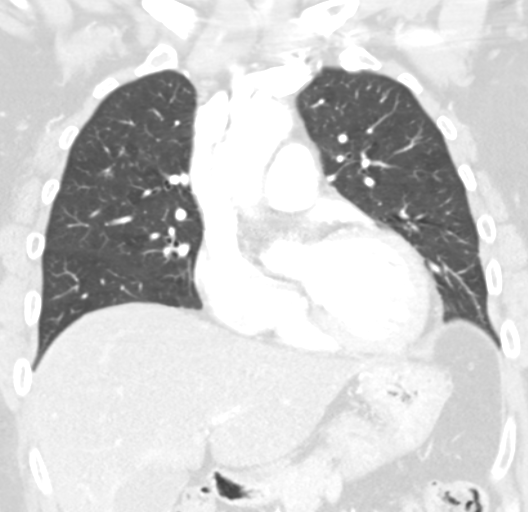
[im 43/71  lung]
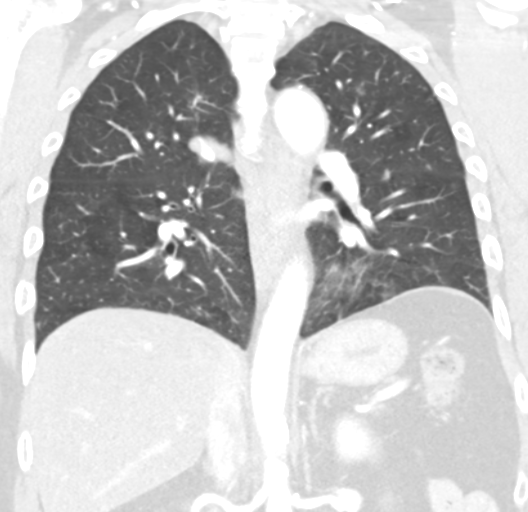

[15 of 36 positions shown; findings below may reference images not displayed]

FINDINGS: There is no axillary, hilar or mediastinal lymphadenopathy. Heart
size is normal. No pleural or pericardial effusion. Calcific aortic
and coronary atherosclerosis is identified. The lungs show only
minimal dependent atelectasis.

Visualized upper abdomen demonstrates diffuse fatty infiltration of
the liver. There is subtle nodularity of the liver border best seen
along the left hepatic lobe. No focal liver lesion is identified.
Imaged intra-abdominal contents are otherwise unremarkable. No lytic
or sclerotic bony lesion is identified.
IMPRESSION: No acute finding or finding to explain the patient's symptoms.

Diffuse fatty infiltration of the liver. Subtle nodularity of the
liver border is compatible with cirrhosis.

Calcific aortic and coronary atherosclerosis.

## 2016-05-24 DIAGNOSIS — H2511 Age-related nuclear cataract, right eye: Secondary | ICD-10-CM | POA: Diagnosis not present

## 2016-05-30 DIAGNOSIS — H25811 Combined forms of age-related cataract, right eye: Secondary | ICD-10-CM | POA: Diagnosis not present

## 2016-05-30 DIAGNOSIS — H2511 Age-related nuclear cataract, right eye: Secondary | ICD-10-CM | POA: Diagnosis not present

## 2016-06-10 DIAGNOSIS — H35373 Puckering of macula, bilateral: Secondary | ICD-10-CM | POA: Diagnosis not present

## 2016-06-21 ENCOUNTER — Telehealth: Payer: Self-pay | Admitting: *Deleted

## 2016-06-21 ENCOUNTER — Ambulatory Visit (INDEPENDENT_AMBULATORY_CARE_PROVIDER_SITE_OTHER): Payer: Medicare Other | Admitting: Neurology

## 2016-06-21 ENCOUNTER — Other Ambulatory Visit (INDEPENDENT_AMBULATORY_CARE_PROVIDER_SITE_OTHER): Payer: Medicare Other

## 2016-06-21 ENCOUNTER — Encounter: Payer: Self-pay | Admitting: Neurology

## 2016-06-21 VITALS — BP 108/68 | HR 67 | Ht 61.0 in | Wt 143.6 lb

## 2016-06-21 DIAGNOSIS — Z5181 Encounter for therapeutic drug level monitoring: Secondary | ICD-10-CM | POA: Diagnosis not present

## 2016-06-21 DIAGNOSIS — G7000 Myasthenia gravis without (acute) exacerbation: Secondary | ICD-10-CM | POA: Diagnosis not present

## 2016-06-21 LAB — COMPREHENSIVE METABOLIC PANEL
ALK PHOS: 61 U/L (ref 39–117)
ALT: 14 U/L (ref 0–35)
AST: 18 U/L (ref 0–37)
Albumin: 4.1 g/dL (ref 3.5–5.2)
BILIRUBIN TOTAL: 0.5 mg/dL (ref 0.2–1.2)
BUN: 21 mg/dL (ref 6–23)
CALCIUM: 9.8 mg/dL (ref 8.4–10.5)
CO2: 32 mEq/L (ref 19–32)
Chloride: 99 mEq/L (ref 96–112)
Creatinine, Ser: 1 mg/dL (ref 0.40–1.20)
GFR: 58.86 mL/min — ABNORMAL LOW (ref >60.00)
Glucose, Bld: 246 mg/dL — ABNORMAL HIGH (ref 70–99)
POTASSIUM: 4 meq/L (ref 3.5–5.1)
Sodium: 136 mEq/L (ref 135–145)
TOTAL PROTEIN: 7 g/dL (ref 6.0–8.3)

## 2016-06-21 LAB — CBC
HCT: 41.1 % (ref 36.0–46.0)
HEMOGLOBIN: 13.8 g/dL (ref 12.0–15.0)
MCHC: 33.5 g/dL (ref 30.0–36.0)
MCV: 90.1 fl (ref 78.0–100.0)
Platelets: 208 10*3/uL (ref 150.0–400.0)
RBC: 4.56 Mil/uL (ref 3.87–5.11)
RDW: 13.1 % (ref 11.5–15.5)
WBC: 6.1 10*3/uL (ref 4.0–10.5)

## 2016-06-21 NOTE — Patient Instructions (Addendum)
1.  Stop pyridostigmine.  Take only as needed 2.  Week 1 and 2:  Reduce prednisone to 2.5mg  on Monday and Friday for two weeks.       Week 3 and 4:  Reduce prednisone to 2.5mg  on Monday, then STOP.  3.  Continue azathioprine 100mg  daily 4.  Check labs every 3 months  Return to clinic in 3 months

## 2016-06-21 NOTE — Telephone Encounter (Signed)
-----   Message from Alda Berthold, DO sent at 06/21/2016 12:30 PM EDT ----- Please fax labs to PCP as FYI.  Labs are stable, recheck in 3 months.

## 2016-06-21 NOTE — Progress Notes (Signed)
Follow-up Visit   Date: 06/21/16   Cassidy Carlson MRN: [id number] DOB: 10/27/1950   Interim History: Cassidy Carlson is a 66 y.o. right-handed Bengali female with history of diabetes mellitus (diagnosed 2014), hyperlipidemia, GERD and hypertension returning for follow-up of seronegative myasthenia gravis.  She is accompanied by her husband.   History of present illness: Starting on 10/12/2013, she felt weakness of the tongue, double vision, difficulty keeping her eyes, and speech difficulty. She went to the emergency department where she was admitted overnight and had MRI brain which did not show acute stroke. Labs including CK and ACRh antibodies (binding, blocking, and modulating) were nondiagnostic and she was recommended to follow up with neurology for further evaluation.  She developed sluured speech, generalized fatigue, and double vision, described images on top of each other. Of late, she has started to sleep on 4 pillows because laying flat makes her short of breath.  She has no weakness of her arms and legs, but does describe generalized fatigue.  In September, she underwent EMG which showed post-synaptic NMJ disorder consistent with MG.  She started mestinon 60mg  and prednisone 10mg  daily and noticed improvement of her voice and vision. Because of mild dysphagia, prednisone was increased to 15mg  daily but due to GERD she only did this for 10 days and self-tapered back to 10mg  daily.   In June 2016, Imuran 100mg  was started because she was unable to taper her prednisone less than 15mg  daily.  She has been on a very slow prednisone taper since this time and has been tolerating it well.  UPDATE 08/28/2015:  No new complaints, she has been able to taper her prednisone to 5mg  alternating with 2.5mg .  She arrived back from [place] because her mother passed away and reports that she did well, despite being in hot temperatures.  She feels tired recently and her husband says this is due to  jet lag.    UPDATE 06/21/2016:  She is here for follow-up appointment. She is currently taking prednisone 2.5mg  MWF, mestinon 60mg , and Imuran 100mg  daily.  She denies any double vision, difficulty swallowing, talking, or limb weakness. She had bilateral cataract surgery last month.  No new neurological complaints.   Medications:  Current Outpatient Prescriptions on File Prior to Visit  Medication Sig Dispense Refill  . aspirin 81 MG tablet Take 1 tablet (81 mg total) by mouth daily. 60 tablet 2  . azaTHIOprine (IMURAN) 50 MG tablet Take 2 tablets (100 mg total) by mouth daily. 180 tablet 3  . escitalopram (LEXAPRO) 5 MG tablet     . esomeprazole (NEXIUM) 40 MG capsule Take 40 mg by mouth daily at 12 noon.    Marland Kitchen FARXIGA 5 MG TABS tablet     . metFORMIN (GLUCOPHAGE) 500 MG tablet     . metoprolol succinate (TOPROL-XL) 50 MG 24 hr tablet Take 50 mg by mouth daily. Take with or immediately following a meal.    . ONETOUCH VERIO test strip     . pyridostigmine (MESTINON) 60 MG tablet TAKE ONE TABLET BY MOUTH TWICE DAILY AT 10 AM AND 5 PM. 180 tablet 3  . ranitidine (ZANTAC) 75 MG tablet Take 150 mg by mouth daily as needed for heartburn.    . rosuvastatin (CRESTOR) 10 MG tablet Take 10 mg by mouth daily.    . sitaGLIPtin (JANUVIA) 100 MG tablet Take 100 mg by mouth daily.    . THIAMINE HCL PO Take 1 tablet by mouth daily.    Marland Kitchen  TRIBENZOR 40-10-25 MG TABS      No current facility-administered medications on file prior to visit.     Allergies:  Allergies  Allergen Reactions  . Sulfa Antibiotics Rash  . Pork-Derived Products      Review of Systems:  CONSTITUTIONAL: No fevers, chills, night sweats, or weight loss.   EYES: +visual changes or eye pain ENT: No hearing changes.  No history of nose bleeds.   RESPIRATORY: No cough, wheezing +shortness of breath.   CARDIOVASCULAR: Negative for chest pain, and palpitations.   GI: Negative for abdominal discomfort, blood in stools or black stools.   No recent change in bowel habits.   GU:  No history of incontinence.   MUSCLOSKELETAL: No history of joint pain or swelling.  No myalgias.   SKIN: Negative for lesions, rash, and itching.   ENDOCRINE: Negative for cold or heat intolerance, polydipsia or goiter.   PSYCH:  No depression or anxiety symptoms.   NEURO: As Above.   Vital Signs:  BP 108/68   Pulse 67   Ht 5\' 1"  (1.549 m)   Wt 143 lb 9 oz (65.1 kg)   SpO2 95%   BMI 27.13 kg/m    Neurological Exam: MENTAL STATUS including orientation to time, place, person, recent and remote memory, attention span and concentration, language, and fund of knowledge is normal.  Speech is not dysarthric.  CRANIAL NERVES:   Pupils equal round and reactive to light.  Extraocular muscles intact, mild esotropia of the left eye, no ptosis.  Orbicularis oculi and orbicularis oris are 5/5.  Buccinator is 5/5.  Tongue strength is 5/5.  MOTOR:  Motor strength is 5/5 in all extremities.  COORDINATION/GAIT:  Gait narrow based and stable.   Data: Lab Results  Component Value Date   ALT 10 08/28/2015   AST 14 08/28/2015   ALKPHOS 61 08/28/2015   BILITOT 0.5 08/28/2015   Lab Results  Component Value Date   WBC 7.9 08/28/2015   HGB 13.5 08/28/2015   HCT 39.1 08/28/2015   MCV 88.3 08/28/2015   PLT 227.0 08/28/2015    Myasthenia AChR binding, blocking, and modulating antibody 10/13/2013: Negative MRI brain 10/13/2013: Mild chronic microvascular ischemic change. No acute abnormality.   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 Right median neuropathy, at or distal to the wrist, consistent with the clinical diagnosis of carpal tunnel syndrome. Overall, these findings are mild to moderate in degree electrically.  CT chest 10/30/2013: No acute finding or finding to explain the patient's symptoms.  Diffuse fatty infiltration of the liver. Subtle nodularity of the liver  border is compatible with cirrhosis.  Calcific aortic and coronary atherosclerosis.    IMPRESSION: Seronegative generalized myasthenia gravis (diagnosed 10/23/2013) Presenting symptoms:  fatigue, diplopia, dysarthria, and dysphagia in August 2015 Clinically stable, without evidence of bulbar weakness  Given that she has been unable to taper prednisone < 15mg , she was started on azathrioprine in June 2016 and has been able to slowly taper prednisone  Diabetes mellitus, followed by PCP   PLAN/RECOMMENDATIONS:  1.  Reduce prednisone 2.5mg  Monday-Friday x 2 weeks, then reduce to 2.5mg  every Monday x 2 weeks, then stop. 2.  Stop mestinon.  Okay take only as needed 3.  Continue azathioprine 100mg  daily 4.  Check CBC and CMP every 3 months.  She is due to labs.  5.  Continue taking nexium and calcium supplements  Return to clinic in 3 months   The duration  of this appointment visit was 25 minutes of face-to-face time with the patient.  Greater than 50% of this time was spent in counseling, explanation of diagnosis, planning of further management, and coordination of care.   Thank you for allowing me to participate in patient's care.  If I can answer any additional questions, I would be pleased to do so.    Sincerely,    Yetzali Weld K. Posey Pronto, DO

## 2016-06-21 NOTE — Telephone Encounter (Signed)
Left message notifying patient and lab results faxed to Dr. Dagmar Hait.

## 2016-06-27 DIAGNOSIS — Z6826 Body mass index (BMI) 26.0-26.9, adult: Secondary | ICD-10-CM | POA: Diagnosis not present

## 2016-06-27 DIAGNOSIS — R05 Cough: Secondary | ICD-10-CM | POA: Diagnosis not present

## 2016-06-27 DIAGNOSIS — G7001 Myasthenia gravis with (acute) exacerbation: Secondary | ICD-10-CM | POA: Diagnosis not present

## 2016-06-27 DIAGNOSIS — Z0000 Encounter for general adult medical examination without abnormal findings: Secondary | ICD-10-CM | POA: Diagnosis not present

## 2016-06-27 DIAGNOSIS — R143 Flatulence: Secondary | ICD-10-CM | POA: Diagnosis not present

## 2016-06-27 DIAGNOSIS — E784 Other hyperlipidemia: Secondary | ICD-10-CM | POA: Diagnosis not present

## 2016-06-27 DIAGNOSIS — M8580 Other specified disorders of bone density and structure, unspecified site: Secondary | ICD-10-CM | POA: Diagnosis not present

## 2016-06-27 DIAGNOSIS — F39 Unspecified mood [affective] disorder: Secondary | ICD-10-CM | POA: Diagnosis not present

## 2016-06-27 DIAGNOSIS — I10 Essential (primary) hypertension: Secondary | ICD-10-CM | POA: Diagnosis not present

## 2016-06-27 DIAGNOSIS — Z1389 Encounter for screening for other disorder: Secondary | ICD-10-CM | POA: Diagnosis not present

## 2016-06-27 DIAGNOSIS — E1165 Type 2 diabetes mellitus with hyperglycemia: Secondary | ICD-10-CM | POA: Diagnosis not present

## 2016-07-06 DIAGNOSIS — M859 Disorder of bone density and structure, unspecified: Secondary | ICD-10-CM | POA: Diagnosis not present

## 2016-07-06 DIAGNOSIS — M8580 Other specified disorders of bone density and structure, unspecified site: Secondary | ICD-10-CM | POA: Diagnosis not present

## 2016-07-06 DIAGNOSIS — E119 Type 2 diabetes mellitus without complications: Secondary | ICD-10-CM | POA: Diagnosis not present

## 2016-09-21 ENCOUNTER — Telehealth: Payer: Self-pay | Admitting: Neurology

## 2016-09-21 NOTE — Telephone Encounter (Signed)
Patient is having a terrible dry cough that will not stop.  Informed her that this is from her cold last week and not her MG.  Informed her that we could get her in earlier if needed but it would be an afternoon appointment.  She prefers morning appointments so I told her that I didn't think it was necessary for her to come in earlier than her follow up.  She agreed to wait and see Dr. Posey Pronto at her scheduled follow up.

## 2016-09-21 NOTE — Telephone Encounter (Signed)
PT called and said she is having a horrible time with her muscles

## 2016-09-23 DIAGNOSIS — R05 Cough: Secondary | ICD-10-CM | POA: Diagnosis not present

## 2016-09-23 DIAGNOSIS — K219 Gastro-esophageal reflux disease without esophagitis: Secondary | ICD-10-CM | POA: Diagnosis not present

## 2016-09-23 DIAGNOSIS — Z6825 Body mass index (BMI) 25.0-25.9, adult: Secondary | ICD-10-CM | POA: Diagnosis not present

## 2016-10-19 ENCOUNTER — Encounter: Payer: Self-pay | Admitting: Neurology

## 2016-10-19 ENCOUNTER — Ambulatory Visit (INDEPENDENT_AMBULATORY_CARE_PROVIDER_SITE_OTHER): Payer: Medicare Other | Admitting: Neurology

## 2016-10-19 VITALS — BP 124/74 | HR 64 | Ht 61.0 in | Wt 139.2 lb

## 2016-10-19 DIAGNOSIS — Z5181 Encounter for therapeutic drug level monitoring: Secondary | ICD-10-CM | POA: Diagnosis not present

## 2016-10-19 DIAGNOSIS — G7000 Myasthenia gravis without (acute) exacerbation: Secondary | ICD-10-CM

## 2016-10-19 NOTE — Patient Instructions (Addendum)
Check CBC and CMP in October. You will need to come by our office to check into the lab. You do not need an appt. After check in you will go to [facility] ([id number]) on the second floor of this building.  Continue azathioprine 100mg  daily  Return to clinic in January 2019

## 2016-10-19 NOTE — Progress Notes (Signed)
Follow-up Visit   Date: 10/19/16   Gaetana Kawahara MRN: [id number] DOB: 27-Nov-1950   Interim History: Cherylanne Ardelean is a 66 y.o. right-handed Bengali female with history of diabetes mellitus (diagnosed 2014), hyperlipidemia, GERD and hypertension returning for follow-up of seronegative myasthenia gravis.  She is accompanied by her husband.   History of present illness: Starting on 10/12/2013, she felt weakness of the tongue, double vision, difficulty keeping her eyes, and speech difficulty. She went to the emergency department where MRI brain was normal. Labs including CK and ACRh antibodies (binding, blocking, and modulating) were nondiagnostic and she was recommended to follow up with neurology for further evaluation.  She developed sluured speech, generalized fatigue, and double vision, described images on top of each other, and started to sleep on 4 pillows. She has no weakness of her arms and legs, but does describe generalized fatigue.  In September 2015, she underwent EMG which showed post-synaptic NMJ disorder consistent with MG.  She started mestinon 60mg  and prednisone 10mg  daily and noticed improvement of her voice and vision. Because of mild dysphagia, prednisone was increased to 15mg  daily but due to GERD she only did this for 10 days and self-tapered back to 10mg  daily.   In June 2016, Imuran 100mg  was started because she was unable to taper her prednisone less than 15mg  daily.  She has been on a very slow prednisone taper since this time and has been tolerating it well.  UPDATE 10/19/2016:  Since her last visit, she has been able taper her prednisone and discontinued this in June.  She did not have any break through symptoms.  She no longer as blurry vision.  Overall, she is doing very well and does not have any new complaints.   Medications:  Current Outpatient Prescriptions on File Prior to Visit  Medication Sig Dispense Refill  . aspirin 81 MG tablet Take 1 tablet (81 mg  total) by mouth daily. 60 tablet 2  . azaTHIOprine (IMURAN) 50 MG tablet Take 2 tablets (100 mg total) by mouth daily. 180 tablet 3  . escitalopram (LEXAPRO) 5 MG tablet     . esomeprazole (NEXIUM) 40 MG capsule Take 40 mg by mouth daily at 12 noon.    Marland Kitchen FARXIGA 5 MG TABS tablet     . ketorolac (ACULAR) 0.4 % SOLN     . metoprolol succinate (TOPROL-XL) 50 MG 24 hr tablet Take 50 mg by mouth daily. Take with or immediately following a meal.    . ofloxacin (OCUFLOX) 0.3 % ophthalmic solution     . ONETOUCH VERIO test strip     . prednisoLONE acetate (PRED FORTE) 1 % ophthalmic suspension     . ranitidine (ZANTAC) 75 MG tablet Take 150 mg by mouth daily as needed for heartburn.    . rosuvastatin (CRESTOR) 10 MG tablet Take 10 mg by mouth daily.    . sitaGLIPtin (JANUVIA) 100 MG tablet Take 100 mg by mouth daily.    . THIAMINE HCL PO Take 1 tablet by mouth daily.    Jabier Gauss 40-10-25 MG TABS     . pyridostigmine (MESTINON) 60 MG tablet TAKE ONE TABLET BY MOUTH TWICE DAILY AT 10 AM AND 5 PM. (Patient not taking: Reported on 10/19/2016) 180 tablet 3   No current facility-administered medications on file prior to visit.     Allergies:  Allergies  Allergen Reactions  . Sulfa Antibiotics Rash  . Pork-Derived Products      Review of Systems:  CONSTITUTIONAL: No fevers, chills, night sweats, or weight loss.   EYES: +visual changes or eye pain ENT: No hearing changes.  No history of nose bleeds.   RESPIRATORY: No cough, wheezing +shortness of breath.   CARDIOVASCULAR: Negative for chest pain, and palpitations.   GI: Negative for abdominal discomfort, blood in stools or black stools.  No recent change in bowel habits.   GU:  No history of incontinence.   MUSCLOSKELETAL: No history of joint pain or swelling.  No myalgias.   SKIN: Negative for lesions, rash, and itching.   ENDOCRINE: Negative for cold or heat intolerance, polydipsia or goiter.   PSYCH:  No depression or anxiety symptoms.     NEURO: As Above.   Vital Signs:  BP 124/74   Pulse 64   Ht 5\' 1"  (1.549 m)   Wt 139 lb 4 oz (63.2 kg)   SpO2 98%   BMI 26.31 kg/m    Neurological Exam: MENTAL STATUS including orientation to time, place, person, recent and remote memory, attention span and concentration, language, and fund of knowledge is normal.  Speech is not dysarthric.  CRANIAL NERVES:   Pupils equal round and reactive to light.  Extraocular muscles intact, mild esotropia of the left eye, no ptosis.  Orbicularis oculi and orbicularis oris are 5/5.  Buccinator is 5/5.  Tongue strength is 5/5.  MOTOR:  Motor strength is 5/5 in all extremities.  COORDINATION/GAIT:  Gait narrow based and stable.   Data: Lab Results  Component Value Date   ALT 14 06/21/2016   AST 18 06/21/2016   ALKPHOS 61 06/21/2016   BILITOT 0.5 06/21/2016   Lab Results  Component Value Date   WBC 6.1 06/21/2016   HGB 13.8 06/21/2016   HCT 41.1 06/21/2016   MCV 90.1 06/21/2016   PLT 208.0 06/21/2016    Myasthenia AChR binding, blocking, and modulating antibody 10/13/2013: Negative MRI brain 10/13/2013: Mild chronic microvascular ischemic change. No acute abnormality.   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 Right median neuropathy, at or distal to the wrist, consistent with the clinical diagnosis of carpal tunnel syndrome. Overall, these findings are mild to moderate in degree electrically.  CT chest 10/30/2013: No acute finding or finding to explain the patient's symptoms.  Diffuse fatty infiltration of the liver. Subtle nodularity of the liver border is compatible with cirrhosis.  Calcific aortic and coronary atherosclerosis.    IMPRESSION: Seronegative generalized myasthenia gravis (diagnosed 10/23/2013) Presenting symptoms:  fatigue, diplopia, dysarthria, and dysphagia in August 2015 Clinically stable, without evidence of bulbar weakness  She  was unable to taper prednisone < 15mg , and started on azathrioprine in June 2016.  She had very slowly taper prednisone which was discontinued in June 2018.  She has lost about 10lb in the past year which may be contributed by lowering his prednisone.  Her blood sugars are also improved.   1.  Continue azathioprine 100mg  daily 2.  OK to use mestinon 60mg  as needed 3.  Check CBC and CMP every 3-4 months.  Return to clinic in 6 months   The duration of this appointment visit was 20 minutes of face-to-face time with the patient.  Greater than 50% of this time was spent in counseling, explanation of diagnosis, planning of further management, and coordination of care.   Thank you for allowing me to participate in patient's care.  If I can answer any additional questions, I would be pleased to do so.  Sincerely,    Senna Lape K. Posey Pronto, DO

## 2016-11-28 ENCOUNTER — Other Ambulatory Visit (INDEPENDENT_AMBULATORY_CARE_PROVIDER_SITE_OTHER): Payer: Medicare Other

## 2016-11-28 DIAGNOSIS — Z5181 Encounter for therapeutic drug level monitoring: Secondary | ICD-10-CM | POA: Diagnosis not present

## 2016-11-28 DIAGNOSIS — G7000 Myasthenia gravis without (acute) exacerbation: Secondary | ICD-10-CM | POA: Diagnosis not present

## 2016-11-29 ENCOUNTER — Telehealth: Payer: Self-pay | Admitting: *Deleted

## 2016-11-29 LAB — CBC WITH DIFFERENTIAL/PLATELET
BASOS PCT: 0.4 %
Basophils Absolute: 20 cells/uL (ref 0–200)
EOS ABS: 332 {cells}/uL (ref 15–500)
EOS PCT: 6.5 %
HCT: 37.5 % (ref 35.0–45.0)
HEMOGLOBIN: 12.7 g/dL (ref 11.7–15.5)
LYMPHS ABS: 1341 {cells}/uL (ref 850–3900)
MCH: 30.3 pg (ref 27.0–33.0)
MCHC: 33.9 g/dL (ref 32.0–36.0)
MCV: 89.5 fL (ref 80.0–100.0)
MPV: 10.6 fL (ref 7.5–12.5)
Monocytes Relative: 8.1 %
NEUTROS ABS: 2994 {cells}/uL (ref 1500–7800)
Neutrophils Relative %: 58.7 %
Platelets: 201 10*3/uL (ref 140–400)
RBC: 4.19 10*6/uL (ref 3.80–5.10)
RDW: 13.3 % (ref 11.0–15.0)
Total Lymphocyte: 26.3 %
WBC mixed population: 413 cells/uL (ref 200–950)
WBC: 5.1 10*3/uL (ref 3.8–10.8)

## 2016-11-29 LAB — COMPREHENSIVE METABOLIC PANEL
AG RATIO: 1.5 (calc) (ref 1.0–2.5)
ALT: 14 U/L (ref 6–29)
AST: 19 U/L (ref 10–35)
Albumin: 4 g/dL (ref 3.6–5.1)
Alkaline phosphatase (APISO): 65 U/L (ref 33–130)
BUN: 15 mg/dL (ref 7–25)
CO2: 29 mmol/L (ref 20–32)
Calcium: 9.5 mg/dL (ref 8.6–10.4)
Chloride: 98 mmol/L (ref 98–110)
Creat: 0.85 mg/dL (ref 0.50–0.99)
GLOBULIN: 2.7 g/dL (ref 1.9–3.7)
GLUCOSE: 260 mg/dL — AB (ref 65–99)
POTASSIUM: 4.3 mmol/L (ref 3.5–5.3)
Sodium: 137 mmol/L (ref 135–146)
TOTAL PROTEIN: 6.7 g/dL (ref 6.1–8.1)
Total Bilirubin: 0.5 mg/dL (ref 0.2–1.2)

## 2016-11-29 NOTE — Telephone Encounter (Signed)
Left message giving patient results.  

## 2016-11-29 NOTE — Telephone Encounter (Signed)
-----   Message from Alda Berthold, DO sent at 11/29/2016  9:31 AM EDT ----- Please notify patient lab are stable, sugars are elevated but other labs are within normal limits.  Thank you.

## 2017-01-24 ENCOUNTER — Other Ambulatory Visit: Payer: Self-pay | Admitting: Neurology

## 2017-01-24 ENCOUNTER — Telehealth: Payer: Self-pay | Admitting: Neurology

## 2017-01-24 NOTE — Telephone Encounter (Signed)
[place] and she said she just picked one up but needs the doctors ok to approve her for another one

## 2017-01-24 NOTE — Telephone Encounter (Signed)
Called pharmacy and got prescriptions ordered.

## 2017-03-22 ENCOUNTER — Ambulatory Visit: Payer: Medicare Other | Admitting: Neurology

## 2017-03-22 NOTE — Progress Notes (Deleted)
Follow-up Visit   Date: 03/22/17   Cassidy Carlson MRN: [id number] DOB: [birth year]   Interim History: Cassidy Carlson is a 67 y.o. right-handed Bengali female with history of diabetes mellitus (diagnosed 2014), hyperlipidemia, GERD and hypertension returning for follow-up of seronegative myasthenia gravis.  She is accompanied by her husband.   History of present illness: Starting on 10/12/2013, she felt weakness of the tongue, double vision, difficulty keeping her eyes, and speech difficulty. She went to the emergency department where MRI brain was normal. Labs including CK and ACRh antibodies (binding, blocking, and modulating) were nondiagnostic and she was recommended to follow up with neurology for further evaluation.  She developed sluured speech, generalized fatigue, and double vision, described images on top of each other, and started to sleep on 4 pillows. She has no weakness of her arms and legs, but does describe generalized fatigue.  In September 2015, she underwent EMG which showed post-synaptic NMJ disorder consistent with MG.  She started mestinon 60mg  and prednisone 10mg  daily and noticed improvement of her voice and vision. Because of mild dysphagia, prednisone was increased to 15mg  daily but due to GERD she only did this for 10 days and self-tapered back to 10mg  daily.   In June 2016, Imuran 100mg  was started because she was unable to taper her prednisone less than 15mg  daily.  She has been on a very slow prednisone taper since this time and has been tolerating it well.  UPDATE 10/19/2016:  Since her last visit, she has been able taper her prednisone and discontinued this in June.  She did not have any break through symptoms.  She no longer as blurry vision.  Overall, she is doing very well and does not have any new complaints.   Medications:  Current Outpatient Medications on File Prior to Visit  Medication Sig Dispense Refill  . aspirin 81 MG tablet Take 1 tablet (81 mg  total) by mouth daily. 60 tablet 2  . azaTHIOprine (IMURAN) 50 MG tablet TAKE 2 TABLETS(100 MG) BY MOUTH DAILY 180 tablet 3  . escitalopram (LEXAPRO) 5 MG tablet     . esomeprazole (NEXIUM) 40 MG capsule Take 40 mg by mouth daily at 12 noon.    Marland Kitchen FARXIGA 5 MG TABS tablet     . ketorolac (ACULAR) 0.4 % SOLN     . metoprolol succinate (TOPROL-XL) 50 MG 24 hr tablet Take 50 mg by mouth daily. Take with or immediately following a meal.    . ofloxacin (OCUFLOX) 0.3 % ophthalmic solution     . ONETOUCH VERIO test strip     . prednisoLONE acetate (PRED FORTE) 1 % ophthalmic suspension     . pyridostigmine (MESTINON) 60 MG tablet TAKE ONE TABLET BY MOUTH TWICE DAILY AT 10 AM AND 5 PM. (Patient not taking: Reported on 10/19/2016) 180 tablet 3  . ranitidine (ZANTAC) 75 MG tablet Take 150 mg by mouth daily as needed for heartburn.    . rosuvastatin (CRESTOR) 10 MG tablet Take 10 mg by mouth daily.    . sitaGLIPtin (JANUVIA) 100 MG tablet Take 100 mg by mouth daily.    . THIAMINE HCL PO Take 1 tablet by mouth daily.    Jabier Gauss 40-10-25 MG TABS      No current facility-administered medications on file prior to visit.     Allergies:  Allergies  Allergen Reactions  . Sulfa Antibiotics Rash  . Pork-Derived Products      Review of Systems:  CONSTITUTIONAL: No  fevers, chills, night sweats, or weight loss.   EYES: +visual changes or eye pain ENT: No hearing changes.  No history of nose bleeds.   RESPIRATORY: No cough, wheezing +shortness of breath.   CARDIOVASCULAR: Negative for chest pain, and palpitations.   GI: Negative for abdominal discomfort, blood in stools or black stools.  No recent change in bowel habits.   GU:  No history of incontinence.   MUSCLOSKELETAL: No history of joint pain or swelling.  No myalgias.   SKIN: Negative for lesions, rash, and itching.   ENDOCRINE: Negative for cold or heat intolerance, polydipsia or goiter.   PSYCH:  No depression or anxiety symptoms.   NEURO:  As Above.   Vital Signs:  There were no vitals taken for this visit.   Neurological Exam: MENTAL STATUS including orientation to time, place, person, recent and remote memory, attention span and concentration, language, and fund of knowledge is normal.  Speech is not dysarthric.  CRANIAL NERVES:   Pupils equal round and reactive to light.  Extraocular muscles intact, mild esotropia of the left eye, no ptosis.  Orbicularis oculi and orbicularis oris are 5/5.  Buccinator is 5/5.  Tongue strength is 5/5.  MOTOR:  Motor strength is 5/5 in all extremities.  COORDINATION/GAIT:  Gait narrow based and stable.   Data: Lab Results  Component Value Date   ALT 14 11/28/2016   AST 19 11/28/2016   ALKPHOS 61 06/21/2016   BILITOT 0.5 11/28/2016   Lab Results  Component Value Date   WBC 5.1 11/28/2016   HGB 12.7 11/28/2016   HCT 37.5 11/28/2016   MCV 89.5 11/28/2016   PLT 201 11/28/2016    Myasthenia AChR binding, blocking, and modulating antibody 10/13/2013: Negative MRI brain 10/13/2013: Mild chronic microvascular ischemic change. No acute abnormality.   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 Right median neuropathy, at or distal to the wrist, consistent with the clinical diagnosis of carpal tunnel syndrome. Overall, these findings are mild to moderate in degree electrically.  CT chest 10/30/2013: No acute finding or finding to explain the patient's symptoms.  Diffuse fatty infiltration of the liver. Subtle nodularity of the liver border is compatible with cirrhosis.  Calcific aortic and coronary atherosclerosis.    IMPRESSION: Seronegative generalized myasthenia gravis (diagnosed 10/23/2013).  Clinically, she is doing great without any signs of exacerbation Presenting symptoms:  fatigue, diplopia, dysarthria, and dysphagia  She was unable to taper prednisone < 15mg , and started on azathrioprine in June  2016.  She had very slowly taper prednisone which was discontinued in June 2018.    1.  Continue azathioprine 100mg  daily 2.  Continue mestinon 60mg  daily as needed 3.  Check CBC and CMP every 3-4 months.  Return to clinic in 6 months   Greater than 50% of this *** minute visit was spent in counseling, explanation of diagnosis, planning of further management, and coordination of care ***due to the nature of their ***.   Thank you for allowing me to participate in patient's care.  If I can answer any additional questions, I would be pleased to do so.    Sincerely,    Donika K. Posey Pronto, DO

## 2017-05-15 DIAGNOSIS — I10 Essential (primary) hypertension: Secondary | ICD-10-CM | POA: Diagnosis not present

## 2017-05-15 DIAGNOSIS — E1165 Type 2 diabetes mellitus with hyperglycemia: Secondary | ICD-10-CM | POA: Diagnosis not present

## 2017-05-15 DIAGNOSIS — Z6826 Body mass index (BMI) 26.0-26.9, adult: Secondary | ICD-10-CM | POA: Diagnosis not present

## 2017-06-12 ENCOUNTER — Other Ambulatory Visit (INDEPENDENT_AMBULATORY_CARE_PROVIDER_SITE_OTHER): Payer: Medicare Other

## 2017-06-12 ENCOUNTER — Ambulatory Visit (INDEPENDENT_AMBULATORY_CARE_PROVIDER_SITE_OTHER): Payer: Medicare Other | Admitting: Neurology

## 2017-06-12 ENCOUNTER — Encounter: Payer: Self-pay | Admitting: Neurology

## 2017-06-12 VITALS — BP 114/64 | HR 65 | Ht 61.0 in | Wt 141.4 lb

## 2017-06-12 DIAGNOSIS — G7000 Myasthenia gravis without (acute) exacerbation: Secondary | ICD-10-CM

## 2017-06-12 LAB — CBC WITH DIFFERENTIAL/PLATELET
BASOS PCT: 0.6 % (ref 0.0–3.0)
Basophils Absolute: 0 10*3/uL (ref 0.0–0.1)
EOS PCT: 4.4 % (ref 0.0–5.0)
Eosinophils Absolute: 0.3 10*3/uL (ref 0.0–0.7)
HCT: 39.2 % (ref 36.0–46.0)
HEMOGLOBIN: 13.5 g/dL (ref 12.0–15.0)
Lymphocytes Relative: 26.8 % (ref 12.0–46.0)
Lymphs Abs: 1.7 10*3/uL (ref 0.7–4.0)
MCHC: 34.4 g/dL (ref 30.0–36.0)
MCV: 90.3 fl (ref 78.0–100.0)
Monocytes Absolute: 0.4 10*3/uL (ref 0.1–1.0)
Monocytes Relative: 6.4 % (ref 3.0–12.0)
Neutro Abs: 3.8 10*3/uL (ref 1.4–7.7)
Neutrophils Relative %: 61.8 % (ref 43.0–77.0)
Platelets: 221 10*3/uL (ref 150.0–400.0)
RBC: 4.34 Mil/uL (ref 3.87–5.11)
RDW: 13.1 % (ref 11.5–15.5)
WBC: 6.2 10*3/uL (ref 4.0–10.5)

## 2017-06-12 LAB — HEPATIC FUNCTION PANEL
ALK PHOS: 66 U/L (ref 39–117)
ALT: 21 U/L (ref 0–35)
AST: 26 U/L (ref 0–37)
Albumin: 4.3 g/dL (ref 3.5–5.2)
BILIRUBIN TOTAL: 0.4 mg/dL (ref 0.2–1.2)
Bilirubin, Direct: 0.1 mg/dL (ref 0.0–0.3)
Total Protein: 7.3 g/dL (ref 6.0–8.3)

## 2017-06-12 NOTE — Progress Notes (Signed)
Follow-up Visit   Date: 06/12/17   Cassidy Carlson MRN: [id number] DOB: 05-07-1950   Interim History: Cassidy Carlson is a 67 y.o. right-handed Bengali female with history of diabetes mellitus (diagnosed 2014), hyperlipidemia, GERD and hypertension returning for follow-up of seronegative myasthenia gravis.  She is accompanied by her husband.   History of present illness: Starting on 10/12/2013, she felt weakness of the tongue, double vision, difficulty keeping her eyes, and speech difficulty. She went to the emergency department where MRI brain was normal. Labs including CK and ACRh antibodies (binding, blocking, and modulating) were nondiagnostic and she was recommended to follow up with neurology for further evaluation.  She developed sluured speech, generalized fatigue, and double vision, described images on top of each other, and started to sleep on 4 pillows. She has no weakness of her arms and legs, but does describe generalized fatigue.  In September 2015, she underwent EMG which showed post-synaptic NMJ disorder consistent with MG.  She started mestinon 60mg  and prednisone 10mg  daily and noticed improvement of her voice and vision. Because of mild dysphagia, prednisone was increased to 15mg  daily but due to GERD she only did this for 10 days and self-tapered back to 10mg  daily.   In June 2016, Imuran 100mg  was started because she was unable to taper her prednisone less than 15mg  daily.  She has been on a very slow prednisone taper since this time and has been tolerating it well.  Successfully tapered off prednisone in June 2018.  UPDATE 06/12/2017:  She is here for 9 month follow-up visit and is doing well.  She occasionally has difficulty swallowing, nothing that is persistent. No problems with jaw fatigue, double vision, or droopy vision.  She is compliant with azathioprine 100mg /d and no longer takes mestinon.    Medications:  Current Outpatient Medications on File Prior to Visit    Medication Sig Dispense Refill  . aspirin 81 MG tablet Take 1 tablet (81 mg total) by mouth daily. 60 tablet 2  . azaTHIOprine (IMURAN) 50 MG tablet TAKE 2 TABLETS(100 MG) BY MOUTH DAILY 180 tablet 3  . escitalopram (LEXAPRO) 5 MG tablet     . esomeprazole (NEXIUM) 40 MG capsule Take 40 mg by mouth daily at 12 noon.    Marland Kitchen FARXIGA 5 MG TABS tablet     . ketorolac (ACULAR) 0.4 % SOLN     . metoprolol succinate (TOPROL-XL) 50 MG 24 hr tablet Take 50 mg by mouth daily. Take with or immediately following a meal.    . ofloxacin (OCUFLOX) 0.3 % ophthalmic solution     . ONETOUCH VERIO test strip     . prednisoLONE acetate (PRED FORTE) 1 % ophthalmic suspension     . pyridostigmine (MESTINON) 60 MG tablet TAKE ONE TABLET BY MOUTH TWICE DAILY AT 10 AM AND 5 PM. 180 tablet 3  . ranitidine (ZANTAC) 75 MG tablet Take 150 mg by mouth daily as needed for heartburn.    . rosuvastatin (CRESTOR) 10 MG tablet Take 10 mg by mouth daily.    . sitaGLIPtin (JANUVIA) 100 MG tablet Take 100 mg by mouth daily.    . THIAMINE HCL PO Take 1 tablet by mouth daily.    Jabier Gauss 40-10-25 MG TABS      No current facility-administered medications on file prior to visit.     Allergies:  Allergies  Allergen Reactions  . Sulfa Antibiotics Rash  . Pork-Derived Products      Review of Systems:  CONSTITUTIONAL: No fevers, chills, night sweats, or weight loss.   EYES: +visual changes or eye pain ENT: No hearing changes.  No history of nose bleeds.   RESPIRATORY: No cough, wheezing +shortness of breath.   CARDIOVASCULAR: Negative for chest pain, and palpitations.   GI: Negative for abdominal discomfort, blood in stools or black stools.  No recent change in bowel habits.   GU:  No history of incontinence.   MUSCLOSKELETAL: No history of joint pain or swelling.  No myalgias.   SKIN: Negative for lesions, rash, and itching.   ENDOCRINE: Negative for cold or heat intolerance, polydipsia or goiter.   PSYCH:  No  depression or anxiety symptoms.   NEURO: As Above.   Vital Signs:  BP 114/64   Pulse 65   Ht 5\' 1"  (1.549 m)   Wt 141 lb 6 oz (64.1 kg)   SpO2 97%   BMI 26.71 kg/m    Neurological Exam: MENTAL STATUS including orientation to time, place, person, recent and remote memory, attention span and concentration, language, and fund of knowledge is normal.  Speech is not dysarthric.  CRANIAL NERVES:   Pupils equal round and reactive to light.  Extraocular muscles intact, mild esotropia of the left eye, no ptosis.  Orbicularis oculi and orbicularis oris are 5/5.  Buccinator is 5/5.  Tongue strength is 5/5.  MOTOR:  Motor strength is 5/5 in all extremities.  COORDINATION/GAIT:  Gait narrow based and stable.   Data: Lab Results  Component Value Date   ALT 14 11/28/2016   AST 19 11/28/2016   ALKPHOS 61 06/21/2016   BILITOT 0.5 11/28/2016   Lab Results  Component Value Date   WBC 5.1 11/28/2016   HGB 12.7 11/28/2016   HCT 37.5 11/28/2016   MCV 89.5 11/28/2016   PLT 201 11/28/2016    Myasthenia AChR binding, blocking, and modulating antibody 10/13/2013: Negative MRI brain 10/13/2013: Mild chronic microvascular ischemic change. No acute abnormality.   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 Right median neuropathy, at or distal to the wrist, consistent with the clinical diagnosis of carpal tunnel syndrome. Overall, these findings are mild to moderate in degree electrically.  CT Chest 10/30/2013: No acute finding or finding to explain the patient's symptoms.  Diffuse fatty infiltration of the liver. Subtle nodularity of the liver border is compatible with cirrhosis.  Calcific aortic and coronary atherosclerosis.    IMPRESSION: Seronegative generalized myasthenia gravis (diagnosed 10/23/2013), clinically stable without any weakness.  Presenting symptoms:  fatigue, diplopia, dysarthria, and dysphagia in  August 2015 She was unable to taper prednisone < 15mg , and started on azathrioprine in June 2016.  She had very slowly taper prednisone which was discontinued in June 2018.  No interval exacerbations since diagnosis.   1.  Continue azathioprine 100mg  daily 2.  OK to use mestinon 60mg  as needed 3.  Check CBC and LFTsevery 4 months.  Return to clinic in 9 months  Greater than 50% of this 15 minute visit was spent in counseling, explanation of diagnosis, planning of further management, and coordination of care.   Thank you for allowing me to participate in patient's care.  If I can answer any additional questions, I would be pleased to do so.    Sincerely,    Dmarion Perfect K. Posey Pronto, DO

## 2017-06-12 NOTE — Patient Instructions (Addendum)
Continue your medications as you are taking them  Check labs  Return to clinic in 9 months

## 2017-06-13 ENCOUNTER — Telehealth: Payer: Self-pay | Admitting: *Deleted

## 2017-06-13 NOTE — Telephone Encounter (Signed)
-----   Message from Alda Berthold, DO sent at 06/13/2017 10:09 AM EDT ----- Please notify patient lab are within normal limits.  Thank you.

## 2017-06-13 NOTE — Telephone Encounter (Signed)
Patient given results

## 2017-06-23 DIAGNOSIS — E7849 Other hyperlipidemia: Secondary | ICD-10-CM | POA: Diagnosis not present

## 2017-06-23 DIAGNOSIS — R82998 Other abnormal findings in urine: Secondary | ICD-10-CM | POA: Diagnosis not present

## 2017-06-23 DIAGNOSIS — E1165 Type 2 diabetes mellitus with hyperglycemia: Secondary | ICD-10-CM | POA: Diagnosis not present

## 2017-06-23 DIAGNOSIS — M859 Disorder of bone density and structure, unspecified: Secondary | ICD-10-CM | POA: Diagnosis not present

## 2017-06-28 DIAGNOSIS — K219 Gastro-esophageal reflux disease without esophagitis: Secondary | ICD-10-CM | POA: Diagnosis not present

## 2017-06-28 DIAGNOSIS — G459 Transient cerebral ischemic attack, unspecified: Secondary | ICD-10-CM | POA: Diagnosis not present

## 2017-06-28 DIAGNOSIS — I10 Essential (primary) hypertension: Secondary | ICD-10-CM | POA: Diagnosis not present

## 2017-06-28 DIAGNOSIS — Z0000 Encounter for general adult medical examination without abnormal findings: Secondary | ICD-10-CM | POA: Diagnosis not present

## 2017-06-28 DIAGNOSIS — F39 Unspecified mood [affective] disorder: Secondary | ICD-10-CM | POA: Diagnosis not present

## 2017-06-28 DIAGNOSIS — M859 Disorder of bone density and structure, unspecified: Secondary | ICD-10-CM | POA: Diagnosis not present

## 2017-06-28 DIAGNOSIS — E1165 Type 2 diabetes mellitus with hyperglycemia: Secondary | ICD-10-CM | POA: Diagnosis not present

## 2017-06-28 DIAGNOSIS — E7849 Other hyperlipidemia: Secondary | ICD-10-CM | POA: Diagnosis not present

## 2017-06-28 DIAGNOSIS — E668 Other obesity: Secondary | ICD-10-CM | POA: Diagnosis not present

## 2017-06-28 DIAGNOSIS — Z1389 Encounter for screening for other disorder: Secondary | ICD-10-CM | POA: Diagnosis not present

## 2017-06-28 DIAGNOSIS — G7001 Myasthenia gravis with (acute) exacerbation: Secondary | ICD-10-CM | POA: Diagnosis not present

## 2017-06-28 DIAGNOSIS — Z6825 Body mass index (BMI) 25.0-25.9, adult: Secondary | ICD-10-CM | POA: Diagnosis not present

## 2017-07-06 DIAGNOSIS — Z1212 Encounter for screening for malignant neoplasm of rectum: Secondary | ICD-10-CM | POA: Diagnosis not present

## 2017-10-26 ENCOUNTER — Encounter: Payer: Self-pay | Admitting: Neurology

## 2017-11-04 ENCOUNTER — Other Ambulatory Visit: Payer: Self-pay | Admitting: Neurology

## 2017-11-08 DIAGNOSIS — I10 Essential (primary) hypertension: Secondary | ICD-10-CM | POA: Diagnosis not present

## 2017-11-08 DIAGNOSIS — E1165 Type 2 diabetes mellitus with hyperglycemia: Secondary | ICD-10-CM | POA: Diagnosis not present

## 2017-11-08 DIAGNOSIS — Z6825 Body mass index (BMI) 25.0-25.9, adult: Secondary | ICD-10-CM | POA: Diagnosis not present

## 2017-11-08 DIAGNOSIS — E7849 Other hyperlipidemia: Secondary | ICD-10-CM | POA: Diagnosis not present

## 2017-11-08 DIAGNOSIS — G7001 Myasthenia gravis with (acute) exacerbation: Secondary | ICD-10-CM | POA: Diagnosis not present

## 2017-11-08 DIAGNOSIS — F39 Unspecified mood [affective] disorder: Secondary | ICD-10-CM | POA: Diagnosis not present

## 2017-11-08 DIAGNOSIS — E668 Other obesity: Secondary | ICD-10-CM | POA: Diagnosis not present

## 2017-11-10 DIAGNOSIS — L989 Disorder of the skin and subcutaneous tissue, unspecified: Secondary | ICD-10-CM | POA: Diagnosis not present

## 2017-11-10 DIAGNOSIS — D485 Neoplasm of uncertain behavior of skin: Secondary | ICD-10-CM | POA: Diagnosis not present

## 2018-01-05 DIAGNOSIS — L728 Other follicular cysts of the skin and subcutaneous tissue: Secondary | ICD-10-CM | POA: Diagnosis not present

## 2018-01-05 DIAGNOSIS — D225 Melanocytic nevi of trunk: Secondary | ICD-10-CM | POA: Diagnosis not present

## 2018-02-17 ENCOUNTER — Other Ambulatory Visit: Payer: Self-pay | Admitting: Neurology

## 2018-02-19 ENCOUNTER — Telehealth: Payer: Self-pay | Admitting: Neurology

## 2018-02-19 NOTE — Telephone Encounter (Signed)
Patient needs a 6 month refill of the azathioprine  Called into the [facility]

## 2018-02-19 NOTE — Telephone Encounter (Signed)
Rx has already been sent in 

## 2018-03-16 ENCOUNTER — Ambulatory Visit: Payer: Medicare Other | Admitting: Neurology

## 2018-05-04 ENCOUNTER — Ambulatory Visit: Payer: Medicare Other | Admitting: Neurology

## 2018-06-28 DIAGNOSIS — M859 Disorder of bone density and structure, unspecified: Secondary | ICD-10-CM | POA: Diagnosis not present

## 2018-06-28 DIAGNOSIS — I10 Essential (primary) hypertension: Secondary | ICD-10-CM | POA: Diagnosis not present

## 2018-06-28 DIAGNOSIS — E7849 Other hyperlipidemia: Secondary | ICD-10-CM | POA: Diagnosis not present

## 2018-06-28 DIAGNOSIS — E1165 Type 2 diabetes mellitus with hyperglycemia: Secondary | ICD-10-CM | POA: Diagnosis not present

## 2018-06-29 DIAGNOSIS — R82998 Other abnormal findings in urine: Secondary | ICD-10-CM | POA: Diagnosis not present

## 2018-06-29 DIAGNOSIS — I10 Essential (primary) hypertension: Secondary | ICD-10-CM | POA: Diagnosis not present

## 2018-07-05 DIAGNOSIS — Z1339 Encounter for screening examination for other mental health and behavioral disorders: Secondary | ICD-10-CM | POA: Diagnosis not present

## 2018-07-05 DIAGNOSIS — F39 Unspecified mood [affective] disorder: Secondary | ICD-10-CM | POA: Diagnosis not present

## 2018-07-05 DIAGNOSIS — Z1331 Encounter for screening for depression: Secondary | ICD-10-CM | POA: Diagnosis not present

## 2018-07-05 DIAGNOSIS — K219 Gastro-esophageal reflux disease without esophagitis: Secondary | ICD-10-CM | POA: Diagnosis not present

## 2018-07-05 DIAGNOSIS — G7001 Myasthenia gravis with (acute) exacerbation: Secondary | ICD-10-CM | POA: Diagnosis not present

## 2018-07-05 DIAGNOSIS — Z0000 Encounter for general adult medical examination without abnormal findings: Secondary | ICD-10-CM | POA: Diagnosis not present

## 2018-07-05 DIAGNOSIS — E785 Hyperlipidemia, unspecified: Secondary | ICD-10-CM | POA: Diagnosis not present

## 2018-07-05 DIAGNOSIS — M8580 Other specified disorders of bone density and structure, unspecified site: Secondary | ICD-10-CM | POA: Diagnosis not present

## 2018-07-05 DIAGNOSIS — E669 Obesity, unspecified: Secondary | ICD-10-CM | POA: Diagnosis not present

## 2018-07-05 DIAGNOSIS — I10 Essential (primary) hypertension: Secondary | ICD-10-CM | POA: Diagnosis not present

## 2018-07-05 DIAGNOSIS — G459 Transient cerebral ischemic attack, unspecified: Secondary | ICD-10-CM | POA: Diagnosis not present

## 2018-07-05 DIAGNOSIS — E1165 Type 2 diabetes mellitus with hyperglycemia: Secondary | ICD-10-CM | POA: Diagnosis not present

## 2018-07-18 DIAGNOSIS — E1165 Type 2 diabetes mellitus with hyperglycemia: Secondary | ICD-10-CM | POA: Diagnosis not present

## 2018-07-20 DIAGNOSIS — K219 Gastro-esophageal reflux disease without esophagitis: Secondary | ICD-10-CM | POA: Diagnosis not present

## 2018-07-20 DIAGNOSIS — F39 Unspecified mood [affective] disorder: Secondary | ICD-10-CM | POA: Diagnosis not present

## 2018-07-20 DIAGNOSIS — R110 Nausea: Secondary | ICD-10-CM | POA: Diagnosis not present

## 2018-07-20 DIAGNOSIS — I10 Essential (primary) hypertension: Secondary | ICD-10-CM | POA: Diagnosis not present

## 2018-08-08 ENCOUNTER — Encounter: Payer: Self-pay | Admitting: Neurology

## 2018-08-08 ENCOUNTER — Telehealth: Payer: Self-pay

## 2018-08-08 ENCOUNTER — Telehealth (INDEPENDENT_AMBULATORY_CARE_PROVIDER_SITE_OTHER): Payer: Medicare Other | Admitting: Neurology

## 2018-08-08 ENCOUNTER — Other Ambulatory Visit: Payer: Self-pay

## 2018-08-08 VITALS — Ht 61.0 in | Wt 142.0 lb

## 2018-08-08 DIAGNOSIS — G7000 Myasthenia gravis without (acute) exacerbation: Secondary | ICD-10-CM | POA: Diagnosis not present

## 2018-08-08 DIAGNOSIS — Z5181 Encounter for therapeutic drug level monitoring: Secondary | ICD-10-CM

## 2018-08-08 NOTE — Progress Notes (Signed)
   Virtual Visit via Video Note The purpose of this virtual visit is to provide medical care while limiting exposure to the novel coronavirus.    Consent was obtained for video visit:  Yes.   Answered questions that patient had about telehealth interaction:  Yes.   I discussed the limitations, risks, security and privacy concerns of performing an evaluation and management service by telemedicine. I also discussed with the patient that there may be a patient responsible charge related to this service. The patient expressed understanding and agreed to proceed.  Pt location: Home Physician Location: office Name of referring provider:  Prince Solian, MD I connected with Cassidy Carlson at patients initiation/request on 08/08/2018 at  8:50 AM EDT by video enabled telemedicine application and verified that I am speaking with the correct person using two identifiers. Pt MRN:  [id number] Pt DOB:  1950/03/07 Video Participants:  Cassidy Carlson   History of Present Illness: This is a 68 y.o. female returning for follow-up of seronegative myasthenia gravis.  She has been doing very well on azathioprine 100 mg daily.  She feels that she has some heartburn after she takes the medication with dinner.  She does not take Mestinon.  She denies any double vision, difficulty swallowing, jaw fatigue, or limb weakness.  She saw her PCP last month and had labs checked.  I will review whether she had a CBC and CMP.   Observations/Objective:   Vitals:   08/08/18 0810  Weight: 142 lb (64.4 kg)  Height: 5\' 1"  (1.549 m)   Patient is awake, alert, and appears comfortable.  Oriented x 4.   Extraocular muscles are intact. No ptosis.  Face is symmetric.  Speech is not dysarthric. Tongue is midline. Antigravity in all extremities.  No pronator drift. Gait appears normal.  Assessment and Plan:  Seronegative myasthenia gravis without exacerbation  -Symptoms are very well controlled on azathioprine alone.  She is  concerned about heartburn as a side effect of the medication.  She can try taking azathioprine 50 mg twice a day and see if this helps.  Medication were refilled.    -I will contact her PCPs office most recent CBC and CMP, if this has not been done, we will contact her for this to be drawn.   Follow Up Instructions:   I discussed the assessment and treatment plan with the patient. The patient was provided an opportunity to ask questions and all were answered. The patient agreed with the plan and demonstrated an understanding of the instructions.   The patient was advised to call back or seek an in-person evaluation if the symptoms worsen or if the condition fails to improve as anticipated.  Follow-up in 6 months   Alda Berthold, DO

## 2018-08-08 NOTE — Telephone Encounter (Signed)
CBC, CMP requested from Dr Danna Hefty office [facility]) today. Need the last two months if they have been performed

## 2018-08-10 NOTE — Progress Notes (Signed)
Addendum  Labs rec'd from [facility] 07/18/2018: Sodium 139, potassium 4.8, creatinine 0.8, AST 16, ALT 13  I will plan to check CBC with diff and CMP next month.    Cambry Spampinato K. Posey Pronto, DO

## 2018-08-13 ENCOUNTER — Other Ambulatory Visit: Payer: Self-pay | Admitting: Neurology

## 2018-08-13 ENCOUNTER — Other Ambulatory Visit: Payer: Self-pay

## 2018-08-13 DIAGNOSIS — Z5181 Encounter for therapeutic drug level monitoring: Secondary | ICD-10-CM

## 2018-08-13 DIAGNOSIS — G7000 Myasthenia gravis without (acute) exacerbation: Secondary | ICD-10-CM

## 2018-10-08 DIAGNOSIS — I10 Essential (primary) hypertension: Secondary | ICD-10-CM | POA: Diagnosis not present

## 2018-11-29 DIAGNOSIS — E785 Hyperlipidemia, unspecified: Secondary | ICD-10-CM | POA: Diagnosis not present

## 2018-11-29 DIAGNOSIS — R0789 Other chest pain: Secondary | ICD-10-CM | POA: Diagnosis not present

## 2018-11-29 DIAGNOSIS — F39 Unspecified mood [affective] disorder: Secondary | ICD-10-CM | POA: Diagnosis not present

## 2018-11-29 DIAGNOSIS — K219 Gastro-esophageal reflux disease without esophagitis: Secondary | ICD-10-CM | POA: Diagnosis not present

## 2018-11-29 DIAGNOSIS — G7001 Myasthenia gravis with (acute) exacerbation: Secondary | ICD-10-CM | POA: Diagnosis not present

## 2018-11-29 DIAGNOSIS — E669 Obesity, unspecified: Secondary | ICD-10-CM | POA: Diagnosis not present

## 2018-11-29 DIAGNOSIS — I10 Essential (primary) hypertension: Secondary | ICD-10-CM | POA: Diagnosis not present

## 2018-11-29 DIAGNOSIS — E1165 Type 2 diabetes mellitus with hyperglycemia: Secondary | ICD-10-CM | POA: Diagnosis not present

## 2019-01-26 ENCOUNTER — Other Ambulatory Visit: Payer: Self-pay | Admitting: Neurology

## 2019-01-30 ENCOUNTER — Other Ambulatory Visit: Payer: Self-pay | Admitting: Neurology

## 2019-03-08 ENCOUNTER — Telehealth: Payer: Self-pay | Admitting: Neurology

## 2019-03-08 NOTE — Telephone Encounter (Signed)
Patient called wanting to know if Dr. Posey Pronto recommends she get a Covid-19 vaccine.

## 2019-03-08 NOTE — Telephone Encounter (Signed)
Patient is due for 6 month visit.  Please offer her VV and so I can determine how she is doing and answer concerns about the vaccine.  Thanks.

## 2019-03-08 NOTE — Telephone Encounter (Signed)
Called patient and left a message to call back and schedule a virtual visit for 6 month follow up.

## 2019-03-14 ENCOUNTER — Other Ambulatory Visit: Payer: Self-pay

## 2019-03-14 ENCOUNTER — Telehealth (INDEPENDENT_AMBULATORY_CARE_PROVIDER_SITE_OTHER): Payer: Medicare Other | Admitting: Neurology

## 2019-03-14 ENCOUNTER — Encounter: Payer: Self-pay | Admitting: Neurology

## 2019-03-14 VITALS — Ht 61.0 in | Wt 140.0 lb

## 2019-03-14 DIAGNOSIS — G7000 Myasthenia gravis without (acute) exacerbation: Secondary | ICD-10-CM

## 2019-03-14 MED ORDER — AZATHIOPRINE 50 MG PO TABS: ORAL_TABLET | ORAL | 3 refills | Status: DC

## 2019-03-14 NOTE — Progress Notes (Signed)
   Virtual Visit via Video Note The purpose of this virtual visit is to provide medical care while limiting exposure to the novel coronavirus.    Consent was obtained for video visit:  Yes.   Answered questions that patient had about telehealth interaction:  Yes.   I discussed the limitations, risks, security and privacy concerns of performing an evaluation and management service by telemedicine. I also discussed with the patient that there may be a patient responsible charge related to this service. The patient expressed understanding and agreed to proceed.  Pt location: Home Physician Location: office Name of referring provider:  Prince Solian, MD I connected with Cassidy Carlson at patients initiation/request on 03/14/2019 at 10:30 AM EST by video enabled telemedicine application and verified that I am speaking with the correct person using two identifiers. Pt MRN:  [id number] Pt DOB:  1950/03/03 Video Participants:  Cassidy Carlson   History of Present Illness: This is a 69 y.o. female returning for follow-up of seronegative myasthenia gravis and questions related to Covid vaccine.  Myasthenia gravis remains well controlled on azathioprine 50 mg twice daily.  She does not use Mestinon.  She denies any double vision, difficulty swallowing, generalized weakness.  She complains of itching sensation in the throat along with cough and has been prescribed medication for acid reflux by her PCP.  She had questions whether this would be related to her myasthenia, and reassured her that symptoms are most likely due to GERD.  Myasthenia does not manifest with the symptoms.  Additionally, she is scheduled to have Covid vaccine this weekend and wanted to be sure that she is safe to keep her appointment as planned.  Observations/Objective:   Vitals:   03/14/19 0852  Weight: 140 lb (63.5 kg)  Height: 5\' 1"  (1.549 m)   Patient is awake, alert, and appears comfortable.    Extraocular muscles are intact.  No ptosis.  Face is symmetric.  Facial muscles appear strong. Speech is not dysarthric.  Antigravity in all extremities.   Labs: Labs rec'd from [facility] 07/18/2018: Sodium 139, potassium 4.8, creatinine 0.8, AST 16, ALT 13   Assessment and Plan:  1.  Seronegative myasthenia gravis without exacerbation, clinically asymptomatic.  -Continue azathioprine 50 mg twice daily.  Refills provided for 1 year  -She is no longer on Mestinon  -Check CBC and LFTs every 6 months.  She is due for labs and prefers to have it drawn at her PCP's office.  Last labs reviewed from May 2020.   2.  COVID vaccine counseling.  She was recommended to proceed with taking COVID vaccine especially as we know that individuals with autoimmune disease can develop severe COVID infection.  She had many questions related to this which I addressed.  3.  GERD, followed by PCP.  Patient was reassured throat symptoms are not due to myasthenia.   Follow Up Instructions:   I discussed the assessment and treatment plan with the patient. The patient was provided an opportunity to ask questions and all were answered. The patient agreed with the plan and demonstrated an understanding of the instructions.   The patient was advised to call back or seek an in-person evaluation if the symptoms worsen or if the condition fails to improve as anticipated.  Follow-up in 9 months  Total time spent:  30 minutes     Alda Berthold, DO

## 2019-03-16 ENCOUNTER — Ambulatory Visit: Payer: Medicare Other | Attending: Internal Medicine

## 2019-03-16 DIAGNOSIS — Z23 Encounter for immunization: Secondary | ICD-10-CM | POA: Insufficient documentation

## 2019-03-16 NOTE — Progress Notes (Signed)
   Covid-19 Vaccination Clinic  Name:  Cassidy Carlson    MRN: [id number] DOB: April 07, 1950  03/16/2019  Cassidy Carlson was observed post Covid-19 immunization for 15 minutes without incidence. She was provided with Vaccine Information Sheet and instruction to access the V-Safe system.   Cassidy Carlson was instructed to call 911 with any severe reactions post vaccine: Marland Kitchen Difficulty breathing  . Swelling of your face and throat  . A fast heartbeat  . A bad rash all over your body  . Dizziness and weakness    Immunizations Administered    Name Date Dose VIS Date Route   Pfizer COVID-19 Vaccine 03/16/2019 11:33 AM 0.3 mL 02/01/2019 Intramuscular   Manufacturer: [place]   Lot: [id number]   [place]: [id number]

## 2019-03-25 DIAGNOSIS — E1165 Type 2 diabetes mellitus with hyperglycemia: Secondary | ICD-10-CM | POA: Diagnosis not present

## 2019-03-25 DIAGNOSIS — F39 Unspecified mood [affective] disorder: Secondary | ICD-10-CM | POA: Diagnosis not present

## 2019-03-25 DIAGNOSIS — R0609 Other forms of dyspnea: Secondary | ICD-10-CM | POA: Diagnosis not present

## 2019-03-25 DIAGNOSIS — I10 Essential (primary) hypertension: Secondary | ICD-10-CM | POA: Diagnosis not present

## 2019-03-25 DIAGNOSIS — E669 Obesity, unspecified: Secondary | ICD-10-CM | POA: Diagnosis not present

## 2019-03-25 DIAGNOSIS — M79641 Pain in right hand: Secondary | ICD-10-CM | POA: Diagnosis not present

## 2019-03-25 DIAGNOSIS — G7001 Myasthenia gravis with (acute) exacerbation: Secondary | ICD-10-CM | POA: Diagnosis not present

## 2019-03-25 DIAGNOSIS — E785 Hyperlipidemia, unspecified: Secondary | ICD-10-CM | POA: Diagnosis not present

## 2019-04-06 ENCOUNTER — Ambulatory Visit: Payer: Medicare Other | Attending: Internal Medicine

## 2019-04-06 DIAGNOSIS — Z23 Encounter for immunization: Secondary | ICD-10-CM

## 2019-04-06 NOTE — Progress Notes (Signed)
   Covid-19 Vaccination Clinic  Name:  Cassidy Carlson    MRN: [id number] DOB: 08/04/1950  04/06/2019  Cassidy Carlson was observed post Covid-19 immunization for  15 minutes without incidence. She was provided with Vaccine Information Sheet and instruction to access the V-Safe system.   Cassidy Carlson was instructed to call 911 with any severe reactions post vaccine: Marland Kitchen Difficulty breathing  . Swelling of your face and throat  . A fast heartbeat  . A bad rash all over your body  . Dizziness and weakness    Immunizations Administered    Name Date Dose VIS Date Route   Pfizer COVID-19 Vaccine 04/06/2019 10:12 AM 0.3 mL 02/01/2019 Intramuscular   Manufacturer: [place]   Lot: [id number]   [place]: [id number]

## 2019-05-01 DIAGNOSIS — E1165 Type 2 diabetes mellitus with hyperglycemia: Secondary | ICD-10-CM | POA: Diagnosis not present

## 2019-05-23 DIAGNOSIS — Z1211 Encounter for screening for malignant neoplasm of colon: Secondary | ICD-10-CM | POA: Diagnosis not present

## 2019-05-23 DIAGNOSIS — K219 Gastro-esophageal reflux disease without esophagitis: Secondary | ICD-10-CM | POA: Diagnosis not present

## 2019-07-24 DIAGNOSIS — Z1211 Encounter for screening for malignant neoplasm of colon: Secondary | ICD-10-CM | POA: Diagnosis not present

## 2019-07-24 DIAGNOSIS — D123 Benign neoplasm of transverse colon: Secondary | ICD-10-CM | POA: Diagnosis not present

## 2019-09-11 DIAGNOSIS — M859 Disorder of bone density and structure, unspecified: Secondary | ICD-10-CM | POA: Diagnosis not present

## 2019-09-11 DIAGNOSIS — E1165 Type 2 diabetes mellitus with hyperglycemia: Secondary | ICD-10-CM | POA: Diagnosis not present

## 2019-09-11 DIAGNOSIS — E7849 Other hyperlipidemia: Secondary | ICD-10-CM | POA: Diagnosis not present

## 2019-09-18 DIAGNOSIS — K219 Gastro-esophageal reflux disease without esophagitis: Secondary | ICD-10-CM | POA: Diagnosis not present

## 2019-09-18 DIAGNOSIS — Z0000 Encounter for general adult medical examination without abnormal findings: Secondary | ICD-10-CM | POA: Diagnosis not present

## 2019-09-18 DIAGNOSIS — E669 Obesity, unspecified: Secondary | ICD-10-CM | POA: Diagnosis not present

## 2019-09-18 DIAGNOSIS — E785 Hyperlipidemia, unspecified: Secondary | ICD-10-CM | POA: Diagnosis not present

## 2019-09-18 DIAGNOSIS — M8580 Other specified disorders of bone density and structure, unspecified site: Secondary | ICD-10-CM | POA: Diagnosis not present

## 2019-09-18 DIAGNOSIS — G459 Transient cerebral ischemic attack, unspecified: Secondary | ICD-10-CM | POA: Diagnosis not present

## 2019-09-18 DIAGNOSIS — E1165 Type 2 diabetes mellitus with hyperglycemia: Secondary | ICD-10-CM | POA: Diagnosis not present

## 2019-09-18 DIAGNOSIS — I10 Essential (primary) hypertension: Secondary | ICD-10-CM | POA: Diagnosis not present

## 2019-09-18 DIAGNOSIS — R82998 Other abnormal findings in urine: Secondary | ICD-10-CM | POA: Diagnosis not present

## 2019-09-18 DIAGNOSIS — G7001 Myasthenia gravis with (acute) exacerbation: Secondary | ICD-10-CM | POA: Diagnosis not present

## 2019-09-18 DIAGNOSIS — J302 Other seasonal allergic rhinitis: Secondary | ICD-10-CM | POA: Diagnosis not present

## 2019-09-18 DIAGNOSIS — F39 Unspecified mood [affective] disorder: Secondary | ICD-10-CM | POA: Diagnosis not present

## 2019-09-18 DIAGNOSIS — Z1331 Encounter for screening for depression: Secondary | ICD-10-CM | POA: Diagnosis not present

## 2019-09-23 ENCOUNTER — Telehealth: Payer: Self-pay | Admitting: Neurology

## 2019-09-23 NOTE — Telephone Encounter (Signed)
Labs 09/18/2019: Sodium 141, potassium 4.4, chloride 105, creatinine 1.0, AST 20, ALT 14, WBC 7, Hgb 14, HCT 43, platelet 170, TSH 1.21, vitamin D 38, hemoglobin A1c 6.8

## 2019-09-26 DIAGNOSIS — Z1212 Encounter for screening for malignant neoplasm of rectum: Secondary | ICD-10-CM | POA: Diagnosis not present

## 2019-11-01 ENCOUNTER — Ambulatory Visit: Payer: Medicare Other | Admitting: Neurology

## 2019-12-13 ENCOUNTER — Ambulatory Visit: Payer: Medicare Other | Admitting: Neurology

## 2019-12-21 DIAGNOSIS — Z23 Encounter for immunization: Secondary | ICD-10-CM | POA: Diagnosis not present

## 2020-02-05 DIAGNOSIS — Z1152 Encounter for screening for COVID-19: Secondary | ICD-10-CM | POA: Diagnosis not present

## 2020-02-05 DIAGNOSIS — R519 Headache, unspecified: Secondary | ICD-10-CM | POA: Diagnosis not present

## 2020-02-05 DIAGNOSIS — R0981 Nasal congestion: Secondary | ICD-10-CM | POA: Diagnosis not present

## 2020-02-05 DIAGNOSIS — R059 Cough, unspecified: Secondary | ICD-10-CM | POA: Diagnosis not present

## 2020-02-19 DIAGNOSIS — G459 Transient cerebral ischemic attack, unspecified: Secondary | ICD-10-CM | POA: Diagnosis not present

## 2020-02-19 DIAGNOSIS — E669 Obesity, unspecified: Secondary | ICD-10-CM | POA: Diagnosis not present

## 2020-02-19 DIAGNOSIS — Z23 Encounter for immunization: Secondary | ICD-10-CM | POA: Diagnosis not present

## 2020-02-19 DIAGNOSIS — I10 Essential (primary) hypertension: Secondary | ICD-10-CM | POA: Diagnosis not present

## 2020-02-19 DIAGNOSIS — F39 Unspecified mood [affective] disorder: Secondary | ICD-10-CM | POA: Diagnosis not present

## 2020-02-19 DIAGNOSIS — E1165 Type 2 diabetes mellitus with hyperglycemia: Secondary | ICD-10-CM | POA: Diagnosis not present

## 2020-02-19 DIAGNOSIS — K219 Gastro-esophageal reflux disease without esophagitis: Secondary | ICD-10-CM | POA: Diagnosis not present

## 2020-02-19 DIAGNOSIS — E785 Hyperlipidemia, unspecified: Secondary | ICD-10-CM | POA: Diagnosis not present

## 2020-02-19 DIAGNOSIS — G7001 Myasthenia gravis with (acute) exacerbation: Secondary | ICD-10-CM | POA: Diagnosis not present

## 2020-02-19 DIAGNOSIS — J302 Other seasonal allergic rhinitis: Secondary | ICD-10-CM | POA: Diagnosis not present

## 2020-04-24 ENCOUNTER — Telehealth: Payer: Self-pay | Admitting: Neurology

## 2020-04-24 MED ORDER — AZATHIOPRINE 50 MG PO TABS: ORAL_TABLET | ORAL | 3 refills | Status: DC

## 2020-04-24 NOTE — Telephone Encounter (Signed)
Refilled

## 2020-04-24 NOTE — Telephone Encounter (Signed)
Called patient and informed her that her refill was sent to the pharmacy.

## 2020-04-24 NOTE — Telephone Encounter (Signed)
Pt is out of medication and needs a refill on the imuran 50 mg called in to the [facility] on [address]   She has a follow up appt with Posey Pronto on 06-25-20

## 2020-05-12 DIAGNOSIS — K219 Gastro-esophageal reflux disease without esophagitis: Secondary | ICD-10-CM | POA: Diagnosis not present

## 2020-05-12 DIAGNOSIS — R002 Palpitations: Secondary | ICD-10-CM | POA: Diagnosis not present

## 2020-05-12 DIAGNOSIS — F39 Unspecified mood [affective] disorder: Secondary | ICD-10-CM | POA: Diagnosis not present

## 2020-05-12 DIAGNOSIS — G479 Sleep disorder, unspecified: Secondary | ICD-10-CM | POA: Diagnosis not present

## 2020-05-12 DIAGNOSIS — I10 Essential (primary) hypertension: Secondary | ICD-10-CM | POA: Diagnosis not present

## 2020-05-12 DIAGNOSIS — E119 Type 2 diabetes mellitus without complications: Secondary | ICD-10-CM | POA: Diagnosis not present

## 2020-05-12 DIAGNOSIS — E785 Hyperlipidemia, unspecified: Secondary | ICD-10-CM | POA: Diagnosis not present

## 2020-06-01 ENCOUNTER — Encounter: Payer: Self-pay | Admitting: Neurology

## 2020-06-01 ENCOUNTER — Ambulatory Visit (INDEPENDENT_AMBULATORY_CARE_PROVIDER_SITE_OTHER): Payer: Medicare Other | Admitting: Neurology

## 2020-06-01 ENCOUNTER — Other Ambulatory Visit: Payer: Self-pay

## 2020-06-01 VITALS — BP 130/73 | HR 65 | Ht 61.0 in | Wt 136.0 lb

## 2020-06-01 DIAGNOSIS — Z5181 Encounter for therapeutic drug level monitoring: Secondary | ICD-10-CM

## 2020-06-01 DIAGNOSIS — G7000 Myasthenia gravis without (acute) exacerbation: Secondary | ICD-10-CM | POA: Diagnosis not present

## 2020-06-01 NOTE — Patient Instructions (Addendum)
Continue azathioprine 100mg  daily  Check labs  Return to clinic in 1 year

## 2020-06-01 NOTE — Progress Notes (Signed)
Follow-up Visit   Date: 06/01/20   Kristopher Delk MRN: [id number] DOB: 1950/06/08   Interim History: Cassidy Carlson is a 70 y.o. right-handed Bengali female with history of diabetes mellitus (diagnosed 2014), hyperlipidemia, GERD and hypertension returning for follow-up of seronegative myasthenia gravis.  She is accompanied by her husband.   Her myasthenia is doing great.  No double vision, ptosis, difficulty swallowing/talking, or limb weakness.  She is compliant with azathioprine 100mg /d.  She is not on mestinon. She is having more acid reflux symptoms and has started omeprazole.   Medications:  Current Outpatient Medications on File Prior to Visit  Medication Sig Dispense Refill  . azaTHIOprine (IMURAN) 50 MG tablet TAKE 2 TABLETS(100 MG) BY MOUTH DAILY 180 tablet 3  . canagliflozin (INVOKANA) 100 MG TABS tablet Take by mouth daily before breakfast.    . metoprolol succinate (TOPROL-XL) 50 MG 24 hr tablet Take 50 mg by mouth daily. Take with or immediately following a meal.    . olmesartan (BENICAR) 40 MG tablet Take 1 tablet by mouth daily.    Marland Kitchen omeprazole (PRILOSEC) 10 MG capsule Take 40 mg by mouth daily.    Glory Rosebush VERIO test strip     . rosuvastatin (CRESTOR) 10 MG tablet Take 10 mg by mouth daily.    . sitaGLIPtin (JANUVIA) 100 MG tablet Take 100 mg by mouth daily.    Marland Kitchen escitalopram (LEXAPRO) 5 MG tablet  (Patient not taking: Reported on 06/01/2020)    . ofloxacin (OCUFLOX) 0.3 % ophthalmic solution  (Patient not taking: Reported on 06/01/2020)    . prednisoLONE acetate (PRED FORTE) 1 % ophthalmic suspension  (Patient not taking: Reported on 06/01/2020)    . ranitidine (ZANTAC) 75 MG tablet Take 150 mg by mouth daily as needed for heartburn. (Patient not taking: Reported on 06/01/2020)    . THIAMINE HCL PO Take 1 tablet by mouth daily. (Patient not taking: Reported on 06/01/2020)     No current facility-administered medications on file prior to visit.    Allergies:   Allergies  Allergen Reactions  . Sulfa Antibiotics Rash  . Pork-Derived Products      Vital Signs:  BP 130/73   Pulse 65   Ht 5\' 1"  (1.549 m)   Wt 136 lb (61.7 kg)   SpO2 96%   BMI 25.70 kg/m    Neurological Exam: MENTAL STATUS including orientation to time, place, person, recent and remote memory, attention span and concentration, language, and fund of knowledge is normal.  Speech is not dysarthric.  CRANIAL NERVES:   Pupils equal round and reactive to light.  Extraocular muscles intact, mild esotropia of the left eye, no ptosis.  Orbicularis oculi and orbicularis oris are 5/5.  Buccinator is 5/5.  Tongue strength is 5/5.  MOTOR:  Motor strength is 5/5 in all extremities.  COORDINATION/GAIT:  Gait narrow based and stable.   Data: Lab Results  Component Value Date   ALT 21 06/12/2017   AST 26 06/12/2017   ALKPHOS 66 06/12/2017   BILITOT 0.4 06/12/2017   Lab Results  Component Value Date   WBC 6.2 06/12/2017   HGB 13.5 06/12/2017   HCT 39.2 06/12/2017   MCV 90.3 06/12/2017   PLT 221.0 06/12/2017    Myasthenia AChR binding, blocking, and modulating antibody 10/13/2013: Negative MRI brain 10/13/2013: Mild chronic microvascular ischemic change. No acute abnormality.   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 Overall, these findings are mild to moderate in degree electrically.  CT Chest 10/30/2013: No acute finding or finding to explain the patient's symptoms.  Diffuse fatty infiltration of the liver. Subtle nodularity of the liver border is compatible with cirrhosis.  Calcific aortic and coronary atherosclerosis.    IMPRESSION: Seronegative generalized myasthenia gravis (dx 2015 with bulbar weakness) without exacerbation.  Clinically with no evidence of disease  manifestation and remains without relapse since diagnosis.  - Continue azathioprine 100mg  daily.   - Check CBC with diff and CMP and monitor every 6 months.  She is schedule to see PCP in September   Return to clinic in 1 year   Thank you for allowing me to participate in patient's care.  If I can answer any additional questions, I would be pleased to do so.    Sincerely,    Clemmie Buelna K. Posey Pronto, DO

## 2020-06-24 DIAGNOSIS — F39 Unspecified mood [affective] disorder: Secondary | ICD-10-CM | POA: Diagnosis not present

## 2020-06-24 DIAGNOSIS — K219 Gastro-esophageal reflux disease without esophagitis: Secondary | ICD-10-CM | POA: Diagnosis not present

## 2020-06-24 DIAGNOSIS — E669 Obesity, unspecified: Secondary | ICD-10-CM | POA: Diagnosis not present

## 2020-06-24 DIAGNOSIS — G479 Sleep disorder, unspecified: Secondary | ICD-10-CM | POA: Diagnosis not present

## 2020-06-24 DIAGNOSIS — I10 Essential (primary) hypertension: Secondary | ICD-10-CM | POA: Diagnosis not present

## 2020-06-24 DIAGNOSIS — E119 Type 2 diabetes mellitus without complications: Secondary | ICD-10-CM | POA: Diagnosis not present

## 2020-06-24 DIAGNOSIS — R002 Palpitations: Secondary | ICD-10-CM | POA: Diagnosis not present

## 2020-06-25 ENCOUNTER — Ambulatory Visit: Payer: Medicare Other | Admitting: Neurology

## 2020-07-01 ENCOUNTER — Other Ambulatory Visit: Payer: Self-pay

## 2020-07-01 ENCOUNTER — Other Ambulatory Visit (INDEPENDENT_AMBULATORY_CARE_PROVIDER_SITE_OTHER): Payer: Medicare Other

## 2020-07-01 DIAGNOSIS — Z5181 Encounter for therapeutic drug level monitoring: Secondary | ICD-10-CM

## 2020-07-01 DIAGNOSIS — G7000 Myasthenia gravis without (acute) exacerbation: Secondary | ICD-10-CM | POA: Diagnosis not present

## 2020-07-01 LAB — COMPREHENSIVE METABOLIC PANEL
ALT: 14 U/L (ref 0–35)
AST: 18 U/L (ref 0–37)
Albumin: 4.4 g/dL (ref 3.5–5.2)
Alkaline Phosphatase: 65 U/L (ref 39–117)
BUN: 18 mg/dL (ref 6–23)
CO2: 29 mEq/L (ref 19–32)
Calcium: 9.5 mg/dL (ref 8.4–10.5)
Chloride: 100 mEq/L (ref 96–112)
Creatinine, Ser: 0.9 mg/dL (ref 0.40–1.20)
GFR: 64.74 mL/min (ref >60.00)
Glucose, Bld: 211 mg/dL — ABNORMAL HIGH (ref 70–99)
Potassium: 4 mEq/L (ref 3.5–5.1)
Sodium: 136 mEq/L (ref 135–145)
Total Bilirubin: 0.5 mg/dL (ref 0.2–1.2)
Total Protein: 7.5 g/dL (ref 6.0–8.3)

## 2020-07-01 LAB — CBC WITH DIFFERENTIAL/PLATELET
Basophils Absolute: 0 10*3/uL (ref 0.0–0.1)
Basophils Relative: 0.7 % (ref 0.0–3.0)
Eosinophils Absolute: 0.3 10*3/uL (ref 0.0–0.7)
Eosinophils Relative: 4.6 % (ref 0.0–5.0)
HCT: 40.8 % (ref 36.0–46.0)
Hemoglobin: 13.8 g/dL (ref 12.0–15.0)
Lymphocytes Relative: 39.8 % (ref 12.0–46.0)
Lymphs Abs: 2.6 10*3/uL (ref 0.7–4.0)
MCHC: 33.8 g/dL (ref 30.0–36.0)
MCV: 90.6 fl (ref 78.0–100.0)
Monocytes Absolute: 0.5 10*3/uL (ref 0.1–1.0)
Monocytes Relative: 8.4 % (ref 3.0–12.0)
Neutro Abs: 3 10*3/uL (ref 1.4–7.7)
Neutrophils Relative %: 46.5 % (ref 43.0–77.0)
Platelets: 174 10*3/uL (ref 150.0–400.0)
RBC: 4.51 Mil/uL (ref 3.87–5.11)
RDW: 14.3 % (ref 11.5–15.5)
WBC: 6.5 10*3/uL (ref 4.0–10.5)

## 2020-07-02 ENCOUNTER — Telehealth: Payer: Self-pay | Admitting: Neurology

## 2020-07-03 NOTE — Telephone Encounter (Signed)
See result note.  Results given to husband 2 days ago.

## 2020-07-03 NOTE — Telephone Encounter (Signed)
Pt called back no answer left a voice mail to call back  

## 2020-07-06 NOTE — Telephone Encounter (Signed)
Pt spoke with her husband and he told her about her lab results. No follow up questions

## 2020-08-03 DIAGNOSIS — Z23 Encounter for immunization: Secondary | ICD-10-CM | POA: Diagnosis not present

## 2020-08-25 DIAGNOSIS — E1165 Type 2 diabetes mellitus with hyperglycemia: Secondary | ICD-10-CM | POA: Diagnosis not present

## 2020-11-04 DIAGNOSIS — M859 Disorder of bone density and structure, unspecified: Secondary | ICD-10-CM | POA: Diagnosis not present

## 2020-11-04 DIAGNOSIS — E1165 Type 2 diabetes mellitus with hyperglycemia: Secondary | ICD-10-CM | POA: Diagnosis not present

## 2020-11-04 DIAGNOSIS — E785 Hyperlipidemia, unspecified: Secondary | ICD-10-CM | POA: Diagnosis not present

## 2020-11-11 DIAGNOSIS — Z0000 Encounter for general adult medical examination without abnormal findings: Secondary | ICD-10-CM | POA: Diagnosis not present

## 2020-11-11 DIAGNOSIS — G459 Transient cerebral ischemic attack, unspecified: Secondary | ICD-10-CM | POA: Diagnosis not present

## 2020-11-11 DIAGNOSIS — K219 Gastro-esophageal reflux disease without esophagitis: Secondary | ICD-10-CM | POA: Diagnosis not present

## 2020-11-11 DIAGNOSIS — E785 Hyperlipidemia, unspecified: Secondary | ICD-10-CM | POA: Diagnosis not present

## 2020-11-11 DIAGNOSIS — I10 Essential (primary) hypertension: Secondary | ICD-10-CM | POA: Diagnosis not present

## 2020-11-11 DIAGNOSIS — M8580 Other specified disorders of bone density and structure, unspecified site: Secondary | ICD-10-CM | POA: Diagnosis not present

## 2020-11-11 DIAGNOSIS — G479 Sleep disorder, unspecified: Secondary | ICD-10-CM | POA: Diagnosis not present

## 2020-11-11 DIAGNOSIS — E669 Obesity, unspecified: Secondary | ICD-10-CM | POA: Diagnosis not present

## 2020-11-11 DIAGNOSIS — E119 Type 2 diabetes mellitus without complications: Secondary | ICD-10-CM | POA: Diagnosis not present

## 2020-11-11 DIAGNOSIS — F39 Unspecified mood [affective] disorder: Secondary | ICD-10-CM | POA: Diagnosis not present

## 2020-11-11 DIAGNOSIS — G7001 Myasthenia gravis with (acute) exacerbation: Secondary | ICD-10-CM | POA: Diagnosis not present

## 2020-11-11 DIAGNOSIS — R002 Palpitations: Secondary | ICD-10-CM | POA: Diagnosis not present

## 2020-11-26 ENCOUNTER — Other Ambulatory Visit: Payer: Self-pay | Admitting: Internal Medicine

## 2020-11-26 DIAGNOSIS — Z1231 Encounter for screening mammogram for malignant neoplasm of breast: Secondary | ICD-10-CM

## 2021-02-06 DIAGNOSIS — Z23 Encounter for immunization: Secondary | ICD-10-CM | POA: Diagnosis not present

## 2021-03-17 DIAGNOSIS — J302 Other seasonal allergic rhinitis: Secondary | ICD-10-CM | POA: Diagnosis not present

## 2021-03-17 DIAGNOSIS — M8580 Other specified disorders of bone density and structure, unspecified site: Secondary | ICD-10-CM | POA: Diagnosis not present

## 2021-03-17 DIAGNOSIS — E669 Obesity, unspecified: Secondary | ICD-10-CM | POA: Diagnosis not present

## 2021-03-17 DIAGNOSIS — E785 Hyperlipidemia, unspecified: Secondary | ICD-10-CM | POA: Diagnosis not present

## 2021-03-17 DIAGNOSIS — G479 Sleep disorder, unspecified: Secondary | ICD-10-CM | POA: Diagnosis not present

## 2021-03-17 DIAGNOSIS — R002 Palpitations: Secondary | ICD-10-CM | POA: Diagnosis not present

## 2021-03-17 DIAGNOSIS — G459 Transient cerebral ischemic attack, unspecified: Secondary | ICD-10-CM | POA: Diagnosis not present

## 2021-03-17 DIAGNOSIS — I10 Essential (primary) hypertension: Secondary | ICD-10-CM | POA: Diagnosis not present

## 2021-03-17 DIAGNOSIS — F39 Unspecified mood [affective] disorder: Secondary | ICD-10-CM | POA: Diagnosis not present

## 2021-03-17 DIAGNOSIS — G7001 Myasthenia gravis with (acute) exacerbation: Secondary | ICD-10-CM | POA: Diagnosis not present

## 2021-03-17 DIAGNOSIS — K219 Gastro-esophageal reflux disease without esophagitis: Secondary | ICD-10-CM | POA: Diagnosis not present

## 2021-03-17 DIAGNOSIS — E119 Type 2 diabetes mellitus without complications: Secondary | ICD-10-CM | POA: Diagnosis not present

## 2021-05-07 ENCOUNTER — Other Ambulatory Visit: Payer: Self-pay | Admitting: Neurology

## 2021-06-07 ENCOUNTER — Ambulatory Visit: Payer: Medicare Other | Admitting: Neurology

## 2021-07-13 DIAGNOSIS — H04123 Dry eye syndrome of bilateral lacrimal glands: Secondary | ICD-10-CM | POA: Diagnosis not present

## 2021-07-13 DIAGNOSIS — E119 Type 2 diabetes mellitus without complications: Secondary | ICD-10-CM | POA: Diagnosis not present

## 2021-07-13 DIAGNOSIS — Z961 Presence of intraocular lens: Secondary | ICD-10-CM | POA: Diagnosis not present

## 2021-07-13 DIAGNOSIS — H1045 Other chronic allergic conjunctivitis: Secondary | ICD-10-CM | POA: Diagnosis not present

## 2021-07-13 DIAGNOSIS — H26491 Other secondary cataract, right eye: Secondary | ICD-10-CM | POA: Diagnosis not present

## 2021-08-12 ENCOUNTER — Encounter: Payer: Self-pay | Admitting: Neurology

## 2021-09-23 NOTE — Progress Notes (Signed)
Follow-up Visit   Date: 09/27/21   Cassidy Carlson MRN: [id number] DOB: [birth year]   Interim History: Cassidy Carlson is a 71 y.o. right-handed Bengali female with history of diabetes mellitus (diagnosed 2014), hyperlipidemia, GERD and hypertension returning for follow-up of seronegative myasthenia gravis.  She is accompanied by her husband.   She reports intermittent difficulty with water, medications, and dry foods.  She denies choking spells.  She continues to have problems with GERD.  No double vision or arm/leg weakness.  She is compliant of azathioprine '100mg'$ /d.  She has been off mestinon for > 1 year.  No interval hospitalizations, falls, or illnesses.    Medications:  Current Outpatient Medications on File Prior to Visit  Medication Sig Dispense Refill   azaTHIOprine (IMURAN) 50 MG tablet TAKE 2 TABLETS(100 MG) BY MOUTH DAILY 180 tablet 3   metoprolol succinate (TOPROL-XL) 50 MG 24 hr tablet Take 50 mg by mouth daily. Take with or immediately following a meal.     olmesartan (BENICAR) 40 MG tablet Take 1 tablet by mouth daily.     omeprazole (PRILOSEC) 10 MG capsule Take 40 mg by mouth daily.     ONETOUCH VERIO test strip      rosuvastatin (CRESTOR) 10 MG tablet Take 10 mg by mouth daily.     sitaGLIPtin (JANUVIA) 100 MG tablet Take 100 mg by mouth daily.     canagliflozin (INVOKANA) 100 MG TABS tablet Take by mouth daily before breakfast. (Patient not taking: Reported on 09/27/2021)     escitalopram (LEXAPRO) 5 MG tablet  (Patient not taking: Reported on 06/01/2020)     ofloxacin (OCUFLOX) 0.3 % ophthalmic solution  (Patient not taking: Reported on 06/01/2020)     prednisoLONE acetate (PRED FORTE) 1 % ophthalmic suspension  (Patient not taking: Reported on 06/01/2020)     ranitidine (ZANTAC) 75 MG tablet Take 150 mg by mouth daily as needed for heartburn. (Patient not taking: Reported on 06/01/2020)     THIAMINE HCL PO Take 1 tablet by mouth daily. (Patient not taking: Reported on  06/01/2020)     No current facility-administered medications on file prior to visit.    Allergies:  Allergies  Allergen Reactions   Sulfa Antibiotics Rash   Pork-Derived Products      Vital Signs:  BP (!) 147/71   Pulse 60   Ht '5\' 1"'$  (1.549 m)   Wt 137 lb (62.1 kg)   SpO2 97%   BMI 25.89 kg/m    Neurological Exam: MENTAL STATUS including orientation to time, place, person, recent and remote memory, attention span and concentration, language, and fund of knowledge is normal.  Speech is not dysarthric.  CRANIAL NERVES:   Pupils equal round and reactive to light.  Extraocular muscles intact, mild esotropia of the left eye, no ptosis.  Orbicularis oculi and orbicularis oris are 5/5.  Buccinator is 5/5.  Tongue strength is 5/5.  MOTOR:  Motor strength is 5/5 in all extremities.  COORDINATION/GAIT:  Gait narrow based and stable.   Data: Lab Results  Component Value Date   ALT 14 07/01/2020   AST 18 07/01/2020   ALKPHOS 65 07/01/2020   BILITOT 0.5 07/01/2020   Lab Results  Component Value Date   WBC 6.5 07/01/2020   HGB 13.8 07/01/2020   HCT 40.8 07/01/2020   MCV 90.6 07/01/2020   PLT 174.0 07/01/2020    Myasthenia AChR binding, blocking, and modulating antibody 10/13/2013: Negative MRI brain 10/13/2013: Mild chronic microvascular ischemic change. No  acute abnormality.   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 Right median neuropathy, at or distal to the wrist, consistent with the clinical diagnosis of carpal tunnel syndrome. Overall, these findings are mild to moderate in degree electrically.  CT Chest 10/30/2013: No acute finding or finding to explain the patient's symptoms.  Diffuse fatty infiltration of the liver. Subtle nodularity of the liver border is compatible with cirrhosis.  Calcific aortic and coronary atherosclerosis.    IMPRESSION: Seronegative generalized myasthenia  gravis without exacerbation, diagnosed 2015 with bulbar weakness. Clinically doing doing with no evidence of disease manifestation.  She has mild intermittent dysphagia, for which I will offer mestinon as needed.  Last relapse was with initial diagnosis in 2015 and since then has been well controlled on azathrioprine.   - Continue azathioprine '100mg'$  daily  - Start mestinon '60mg'$  daily as needed for swallowing  - Check CBC with diff and CMP  Return to clinic in 1 year  Thank you for allowing me to participate in patient's care.  If I can answer any additional questions, I would be pleased to do so.    Sincerely,    Sebert Stollings K. Posey Pronto, DO

## 2021-09-27 ENCOUNTER — Ambulatory Visit (INDEPENDENT_AMBULATORY_CARE_PROVIDER_SITE_OTHER): Payer: Medicare Other | Admitting: Neurology

## 2021-09-27 ENCOUNTER — Other Ambulatory Visit: Payer: Self-pay | Admitting: Neurology

## 2021-09-27 ENCOUNTER — Other Ambulatory Visit (INDEPENDENT_AMBULATORY_CARE_PROVIDER_SITE_OTHER): Payer: Medicare Other

## 2021-09-27 ENCOUNTER — Encounter: Payer: Self-pay | Admitting: Neurology

## 2021-09-27 VITALS — BP 147/71 | HR 60 | Ht 61.0 in | Wt 137.0 lb

## 2021-09-27 DIAGNOSIS — G7000 Myasthenia gravis without (acute) exacerbation: Secondary | ICD-10-CM | POA: Diagnosis not present

## 2021-09-27 LAB — CBC WITH DIFFERENTIAL/PLATELET
Basophils Absolute: 0 10*3/uL (ref 0.0–0.1)
Basophils Relative: 0.5 % (ref 0.0–3.0)
Eosinophils Absolute: 0.3 10*3/uL (ref 0.0–0.7)
Eosinophils Relative: 5 % (ref 0.0–5.0)
HCT: 41.6 % (ref 36.0–46.0)
Hemoglobin: 14.1 g/dL (ref 12.0–15.0)
Lymphocytes Relative: 38.2 % (ref 12.0–46.0)
Lymphs Abs: 2.2 10*3/uL (ref 0.7–4.0)
MCHC: 34 g/dL (ref 30.0–36.0)
MCV: 91.4 fl (ref 78.0–100.0)
Monocytes Absolute: 0.5 10*3/uL (ref 0.1–1.0)
Monocytes Relative: 8.9 % (ref 3.0–12.0)
Neutro Abs: 2.7 10*3/uL (ref 1.4–7.7)
Neutrophils Relative %: 47.4 % (ref 43.0–77.0)
Platelets: 174 10*3/uL (ref 150.0–400.0)
RBC: 4.55 Mil/uL (ref 3.87–5.11)
RDW: 13.3 % (ref 11.5–15.5)
WBC: 5.7 10*3/uL (ref 4.0–10.5)

## 2021-09-27 LAB — COMPREHENSIVE METABOLIC PANEL
ALT: 14 U/L (ref 0–35)
AST: 20 U/L (ref 0–37)
Albumin: 4.4 g/dL (ref 3.5–5.2)
Alkaline Phosphatase: 56 U/L (ref 39–117)
BUN: 16 mg/dL (ref 6–23)
CO2: 30 mEq/L (ref 19–32)
Calcium: 9.4 mg/dL (ref 8.4–10.5)
Chloride: 103 mEq/L (ref 96–112)
Creatinine, Ser: 0.89 mg/dL (ref 0.40–1.20)
GFR: 65.05 mL/min (ref >60.00)
Glucose, Bld: 143 mg/dL — ABNORMAL HIGH (ref 70–99)
Potassium: 4.3 mEq/L (ref 3.5–5.1)
Sodium: 138 mEq/L (ref 135–145)
Total Bilirubin: 0.4 mg/dL (ref 0.2–1.2)
Total Protein: 7.6 g/dL (ref 6.0–8.3)

## 2021-09-27 MED ORDER — PYRIDOSTIGMINE BROMIDE 60 MG PO TABS: ORAL_TABLET | ORAL | 0 refills | Status: DC

## 2021-09-27 NOTE — Patient Instructions (Addendum)
Check labs  Continue azathioprine '100mg'$  daily  You may use mestinon daily as needed for swallowing difficulty  Return to clinic in 1 year

## 2021-09-28 ENCOUNTER — Other Ambulatory Visit: Payer: Self-pay

## 2021-09-28 MED ORDER — PYRIDOSTIGMINE BROMIDE 60 MG PO TABS: ORAL_TABLET | ORAL | 0 refills | Status: DC

## 2021-09-29 ENCOUNTER — Telehealth: Payer: Self-pay | Admitting: Neurology

## 2021-09-29 NOTE — Telephone Encounter (Signed)
Patient said she is returning someone's call

## 2021-09-29 NOTE — Telephone Encounter (Signed)
Called patient and left a message for a call back. See result note.

## 2021-11-10 DIAGNOSIS — E785 Hyperlipidemia, unspecified: Secondary | ICD-10-CM | POA: Diagnosis not present

## 2021-11-10 DIAGNOSIS — I10 Essential (primary) hypertension: Secondary | ICD-10-CM | POA: Diagnosis not present

## 2021-11-10 DIAGNOSIS — E1165 Type 2 diabetes mellitus with hyperglycemia: Secondary | ICD-10-CM | POA: Diagnosis not present

## 2021-11-10 DIAGNOSIS — R7989 Other specified abnormal findings of blood chemistry: Secondary | ICD-10-CM | POA: Diagnosis not present

## 2021-11-22 DIAGNOSIS — Z23 Encounter for immunization: Secondary | ICD-10-CM | POA: Diagnosis not present

## 2021-11-22 DIAGNOSIS — E119 Type 2 diabetes mellitus without complications: Secondary | ICD-10-CM | POA: Diagnosis not present

## 2021-11-22 DIAGNOSIS — M8580 Other specified disorders of bone density and structure, unspecified site: Secondary | ICD-10-CM | POA: Diagnosis not present

## 2021-11-22 DIAGNOSIS — I10 Essential (primary) hypertension: Secondary | ICD-10-CM | POA: Diagnosis not present

## 2021-11-22 DIAGNOSIS — E785 Hyperlipidemia, unspecified: Secondary | ICD-10-CM | POA: Diagnosis not present

## 2021-11-22 DIAGNOSIS — Z0000 Encounter for general adult medical examination without abnormal findings: Secondary | ICD-10-CM | POA: Diagnosis not present

## 2021-11-22 DIAGNOSIS — G7001 Myasthenia gravis with (acute) exacerbation: Secondary | ICD-10-CM | POA: Diagnosis not present

## 2021-11-22 DIAGNOSIS — E669 Obesity, unspecified: Secondary | ICD-10-CM | POA: Diagnosis not present

## 2021-11-22 DIAGNOSIS — F39 Unspecified mood [affective] disorder: Secondary | ICD-10-CM | POA: Diagnosis not present

## 2021-11-22 DIAGNOSIS — R002 Palpitations: Secondary | ICD-10-CM | POA: Diagnosis not present

## 2021-11-22 DIAGNOSIS — K219 Gastro-esophageal reflux disease without esophagitis: Secondary | ICD-10-CM | POA: Diagnosis not present

## 2021-11-22 DIAGNOSIS — M8589 Other specified disorders of bone density and structure, multiple sites: Secondary | ICD-10-CM | POA: Diagnosis not present

## 2021-11-22 DIAGNOSIS — G479 Sleep disorder, unspecified: Secondary | ICD-10-CM | POA: Diagnosis not present

## 2021-12-04 DIAGNOSIS — Z23 Encounter for immunization: Secondary | ICD-10-CM | POA: Diagnosis not present

## 2021-12-09 ENCOUNTER — Ambulatory Visit: Payer: Medicare Other | Admitting: Internal Medicine

## 2021-12-09 ENCOUNTER — Encounter: Payer: Self-pay | Admitting: Internal Medicine

## 2021-12-09 VITALS — BP 161/73 | HR 60 | Temp 97.7°F | Resp 16 | Ht 61.0 in | Wt 138.0 lb

## 2021-12-09 DIAGNOSIS — E119 Type 2 diabetes mellitus without complications: Secondary | ICD-10-CM

## 2021-12-09 DIAGNOSIS — I2089 Other forms of angina pectoris: Secondary | ICD-10-CM | POA: Diagnosis not present

## 2021-12-09 DIAGNOSIS — I10 Essential (primary) hypertension: Secondary | ICD-10-CM | POA: Diagnosis not present

## 2021-12-09 DIAGNOSIS — E782 Mixed hyperlipidemia: Secondary | ICD-10-CM

## 2021-12-09 DIAGNOSIS — R002 Palpitations: Secondary | ICD-10-CM | POA: Diagnosis not present

## 2021-12-09 DIAGNOSIS — G7000 Myasthenia gravis without (acute) exacerbation: Secondary | ICD-10-CM

## 2021-12-09 MED ORDER — AMLODIPINE BESYLATE 5 MG PO TABS: 5.0000 mg | ORAL_TABLET | Freq: Every day | ORAL | 2 refills | Status: DC

## 2021-12-09 NOTE — Progress Notes (Signed)
Primary Physician/Referring:  Prince Solian, MD  [name], female    DOB: February 01, 1951, 71 y.o.   MRN: [id number]  No chief complaint on file.  HPI:    Cassidy Carlson  is a 71 y.o. female with past medical history significant for hypertension, hyperlipidemia, and diabetes who is here to establish care with cardiology.  Over the last year or so patient has been having palpitations and chest pain with activity.  She states it has been happening even with household chores.  She is even having some chest pressure with cooking and vacuuming around the house.  Patient has never had anything like this in the past.  She did have a stress test many many years ago but she does not recall when.  Her blood pressure normally is less than 130/80 however patient does admit that it has been running a little bit high recently.  She was recently increased to 10 mg of amlodipine which has been helping.  Otherwise patient denies diaphoresis, syncope, edema, orthopnea, PND, claudication.  Patient does not smoke or drink alcohol.  She denies cardiac history in herself.  Past Medical History:  Diagnosis Date  . Diabetes mellitus ([place])   . GERD (gastroesophageal reflux disease)   . Hyperlipidemia   . Hypertension   . Myasthenia gravis ([place]) 11/04/2013   Past Surgical History:  Procedure Laterality Date  . ABDOMINAL HYSTERECTOMY     Family History  Problem Relation Age of Onset  . Heart disease Father        Deceased, 64  . Hypertension Mother        Living, 4  . Diabetes Sister        x 2  . Healthy Brother   . Healthy Son   . Healthy Daughter     Social History   Tobacco Use  . Smoking status: Never  . Smokeless tobacco: Never  Substance Use Topics  . Alcohol use: No    Alcohol/week: 0.0 standard drinks of alcohol   Marital Status: Married  ROS  Review of Systems  Cardiovascular:  Positive for chest pain, dyspnea on exertion and palpitations.  Objective  Blood pressure  (!) 161/73, pulse 60, temperature 97.7 F (36.5 C), temperature source Temporal, resp. rate 16, height '5\' 1"'$  (1.549 m), weight 138 lb (62.6 kg), SpO2 98 %. Body mass index is 26.07 kg/m.     12/09/2021    8:57 AM 09/27/2021    8:30 AM 06/01/2020    1:39 PM  Vitals with BMI  Height '5\' 1"'$  '5\' 1"'$  '5\' 1"'$   Weight 138 lbs 137 lbs 136 lbs  BMI 26.09 86.7 67.20  Systolic 947 096 283  Diastolic 73 71 73  Pulse 60 60 65     Physical Exam Vitals and nursing note reviewed.  HENT:     Head: Normocephalic and atraumatic.  Cardiovascular:     Rate and Rhythm: Normal rate and regular rhythm.     Pulses: Normal pulses.     Heart sounds: Normal heart sounds. No murmur heard. Pulmonary:     Effort: Pulmonary effort is normal.     Breath sounds: Normal breath sounds.  Abdominal:     General: Bowel sounds are normal.  Musculoskeletal:     Right lower leg: No edema.     Left lower leg: No edema.  Skin:    General: Skin is warm and dry.  Neurological:     Mental Status: She is alert.   Medications and  allergies   Allergies  Allergen Reactions  . Sulfa Antibiotics Rash  . Pork-Derived Products      Medication list after today's encounter   Current Outpatient Medications:  .  amLODipine (NORVASC) 5 MG tablet, Take 1 tablet (5 mg total) by mouth daily., Disp: 30 tablet, Rfl: 2 .  azaTHIOprine (IMURAN) 50 MG tablet, TAKE 2 TABLETS(100 MG) BY MOUTH DAILY, Disp: 180 tablet, Rfl: 3 .  metoprolol succinate (TOPROL-XL) 50 MG 24 hr tablet, Take 50 mg by mouth daily. Take with or immediately following a meal., Disp: , Rfl:  .  ofloxacin (OCUFLOX) 0.3 % ophthalmic solution, , Disp: , Rfl:  .  olmesartan (BENICAR) 40 MG tablet, Take 1 tablet by mouth daily., Disp: , Rfl:  .  rosuvastatin (CRESTOR) 10 MG tablet, Take 10 mg by mouth daily., Disp: , Rfl:  .  sitaGLIPtin (JANUVIA) 100 MG tablet, Take 100 mg by mouth daily., Disp: , Rfl:  .  THIAMINE HCL PO, Take 1 tablet by mouth daily., Disp: , Rfl:  .   canagliflozin (INVOKANA) 100 MG TABS tablet, Take by mouth daily before breakfast. (Patient not taking: Reported on 09/27/2021), Disp: , Rfl:  .  escitalopram (LEXAPRO) 5 MG tablet, , Disp: , Rfl:  .  omeprazole (PRILOSEC) 10 MG capsule, Take 40 mg by mouth daily. (Patient not taking: Reported on 12/09/2021), Disp: , Rfl:  .  ONETOUCH VERIO test strip, , Disp: , Rfl:  .  prednisoLONE acetate (PRED FORTE) 1 % ophthalmic suspension, , Disp: , Rfl:  .  pyridostigmine (MESTINON) 60 MG tablet, Take 60 mg daily as needed for swallowing (Patient not taking: Reported on 12/09/2021), Disp: 90 tablet, Rfl: 0 .  ranitidine (ZANTAC) 75 MG tablet, Take 150 mg by mouth daily as needed for heartburn. (Patient not taking: Reported on 06/01/2020), Disp: , Rfl:   Laboratory examination:   Lab Results  Component Value Date   NA 138 09/27/2021   K 4.3 09/27/2021   CO2 30 09/27/2021   GLUCOSE 143 (H) 09/27/2021   BUN 16 09/27/2021   CREATININE 0.89 09/27/2021   CALCIUM 9.4 09/27/2021   GFRNONAA 75 (L) 10/14/2013       Latest Ref Rng & Units 09/27/2021    9:12 AM 07/01/2020   10:40 AM 06/12/2017   11:28 AM  CMP  Glucose 70 - 99 mg/dL 143  211    BUN 6 - 23 mg/dL 16  18    Creatinine 0.40 - 1.20 mg/dL 0.89  0.90    Sodium 135 - 145 mEq/L 138  136    Potassium 3.5 - 5.1 mEq/L 4.3  4.0    Chloride 96 - 112 mEq/L 103  100    CO2 19 - 32 mEq/L 30  29    Calcium 8.4 - 10.5 mg/dL 9.4  9.5    Total Protein 6.0 - 8.3 g/dL 7.6  7.5  7.3   Total Bilirubin 0.2 - 1.2 mg/dL 0.4  0.5  0.4   Alkaline Phos 39 - 117 U/L 56  65  66   AST 0 - 37 U/L '20  18  26   '$ ALT 0 - 35 U/L '14  14  21       '$ Latest Ref Rng & Units 09/27/2021    9:12 AM 07/01/2020   10:40 AM 06/12/2017   11:28 AM  CBC  WBC 4.0 - 10.5 K/uL 5.7  6.5  6.2   Hemoglobin 12.0 - 15.0 g/dL 14.1  13.8  13.5   Hematocrit 36.0 - 46.0 % 41.6  40.8  39.2   Platelets 150.0 - 400.0 K/uL 174.0  174.0  221.0     Lipid Panel No results for input(s): "CHOL",  "TRIG", "[place]", "VLDL", "HDL", "CHOLHDL", "LDLDIRECT" in the last 8760 hours.  HEMOGLOBIN A1C Lab Results  Component Value Date   HGBA1C 8.5 (H) 10/14/2013   MPG 197 (H) 10/14/2013   TSH No results for input(s): "TSH" in the last 8760 hours.  External labs:     Radiology:    Cardiac Studies:   No results found for this or any previous visit from the past 1095 days.     No results found for this or any previous visit from the past 1095 days.     EKG:   12/09/2021 normal sinus rhythm with incomplete right bundle branch block, heart rate is 60, normal axis, normal R wave progression  Assessment     ICD-10-CM   1. Palpitations  R00.2 EKG 12-Lead    PCV ECHOCARDIOGRAM COMPLETE    PCV MYOCARDIAL PERFUSION WO LEXISCAN    2. Essential hypertension  I10 PCV ECHOCARDIOGRAM COMPLETE    PCV MYOCARDIAL PERFUSION WO LEXISCAN    3. Mixed hyperlipidemia  E78.2 PCV ECHOCARDIOGRAM COMPLETE    PCV MYOCARDIAL PERFUSION WO LEXISCAN    4. Type 2 diabetes mellitus without complication, without long-term current use of insulin (HCC)  E11.9 PCV ECHOCARDIOGRAM COMPLETE    PCV MYOCARDIAL PERFUSION WO LEXISCAN    5. Stable angina pectoris  I20.89 PCV MYOCARDIAL PERFUSION WO LEXISCAN    6. Myasthenia gravis ([place])  G70.00        Orders Placed This Encounter  Procedures  . PCV MYOCARDIAL PERFUSION WO LEXISCAN    Standing Status:   Future    Standing Expiration Date:   02/08/2022  . EKG 12-Lead  . PCV ECHOCARDIOGRAM COMPLETE    Standing Status:   Future    Standing Expiration Date:   12/10/2022    Meds ordered this encounter  Medications  . amLODipine (NORVASC) 5 MG tablet    Sig: Take 1 tablet (5 mg total) by mouth daily.    Dispense:  30 tablet    Refill:  2    There are no discontinued medications.   Recommendations:   Cassidy Carlson is a 71 y.o. female with chest pain and palpitations  Palpitations This is likely anginal equivalent the patient   Stable angina  pectoris Echocardiogram and stress test ordered   Essential hypertension Continue current medication Will reassess adding additional blood pressure medication at next visit Patient instructed to take her blood pressure once a day for the next week and call with the numbers   Mixed hyperlipidemia Continue Crestor   Type 2 diabetes mellitus without complication, without long-term current use of insulin ([place]) Continue current medication   Myasthenia gravis [facility]) Patient following with neurology    Cassidy Flock, DO, [facility]  12/09/2021, 9:53 AM Office: [phone] Pager: [phone]

## 2022-01-10 ENCOUNTER — Ambulatory Visit: Payer: Medicare Other

## 2022-01-10 DIAGNOSIS — E782 Mixed hyperlipidemia: Secondary | ICD-10-CM | POA: Diagnosis not present

## 2022-01-10 DIAGNOSIS — E119 Type 2 diabetes mellitus without complications: Secondary | ICD-10-CM

## 2022-01-10 DIAGNOSIS — R002 Palpitations: Secondary | ICD-10-CM

## 2022-01-10 DIAGNOSIS — I10 Essential (primary) hypertension: Secondary | ICD-10-CM

## 2022-01-10 DIAGNOSIS — R079 Chest pain, unspecified: Secondary | ICD-10-CM | POA: Diagnosis not present

## 2022-01-10 DIAGNOSIS — I2089 Other forms of angina pectoris: Secondary | ICD-10-CM | POA: Diagnosis not present

## 2022-01-18 ENCOUNTER — Ambulatory Visit: Payer: Medicare Other | Admitting: Internal Medicine

## 2022-01-25 ENCOUNTER — Ambulatory Visit: Payer: Medicare Other

## 2022-01-25 VITALS — BP 146/71 | HR 66 | Ht 61.0 in | Wt 139.4 lb

## 2022-01-25 DIAGNOSIS — R002 Palpitations: Secondary | ICD-10-CM | POA: Diagnosis not present

## 2022-01-25 DIAGNOSIS — I10 Essential (primary) hypertension: Secondary | ICD-10-CM

## 2022-01-25 DIAGNOSIS — R0789 Other chest pain: Secondary | ICD-10-CM | POA: Diagnosis not present

## 2022-01-25 MED ORDER — AMLODIPINE BESYLATE 10 MG PO TABS: 10.0000 mg | ORAL_TABLET | Freq: Every day | ORAL | 3 refills | Status: AC

## 2022-01-25 NOTE — Progress Notes (Signed)
Primary Physician/Referring:  Prince Solian, MD  [name], female    DOB: Apr 24, 1950, 71 y.o.   MRN: [id number]  Chief Complaint  Patient presents with   Palpitations   Follow-up    6 weeks   Results    HPI:    Cassidy Carlson  is a 72 y.o. female with past medical history significant for hypertension, hyperlipidemia, and diabetes.  She presents today for 6 week follow-up for palpitations and chest discomfort. Since last visit she underwent echocardiogram and nuclear stress test. She has not had any chest discomfort since last visit. She still has episodes of palpitations when doing household chores or walking at a fast past that occur 1-2 times per week. She describes the palpitations of feeling like her heart is racing for a few seconds and then resolves on its own. Of note, at last office visit blood pressure was elevated. She has only been taking Amlodipine '5mg'$  daily although it was last reported that she was taking '10mg'$  daily. She denies chest pain, shortness of breath, leg edema, orthopnea, PND, syncope.  Past Medical History:  Diagnosis Date   Diabetes mellitus ([place])    GERD (gastroesophageal reflux disease)    Hyperlipidemia    Hypertension    Myasthenia gravis ([place]) 11/04/2013   Past Surgical History:  Procedure Laterality Date   ABDOMINAL HYSTERECTOMY     Family History  Problem Relation Age of Onset   Heart disease Father        Deceased, 74   Hypertension Mother        Living, 5   Diabetes Sister        x 2   Healthy Brother    Healthy Son    Healthy Daughter     Social History   Tobacco Use   Smoking status: Never   Smokeless tobacco: Never  Substance Use Topics   Alcohol use: No    Alcohol/week: 0.0 standard drinks of alcohol   Marital Status: Married  ROS  Review of Systems  Cardiovascular:  Positive for palpitations. Negative for chest pain, dyspnea on exertion, near-syncope and syncope.  Neurological:  Negative for dizziness  and light-headedness.   Objective  Blood pressure (!) 146/71, pulse 66, height '5\' 1"'$  (1.549 m), weight 139 lb 6.4 oz (63.2 kg), SpO2 97 %. Body mass index is 26.34 kg/m.     01/25/2022   10:30 AM 12/09/2021    8:57 AM 09/27/2021    8:30 AM  Vitals with BMI  Height '5\' 1"'$  '5\' 1"'$  '5\' 1"'$   Weight 139 lbs 6 oz 138 lbs 137 lbs  BMI 26.35 16.60 63.0  Systolic 160 109 323  Diastolic 71 73 71  Pulse 66 60 60     Physical Exam Vitals and nursing note reviewed.  Cardiovascular:     Rate and Rhythm: Normal rate and regular rhythm.     Pulses: Normal pulses.     Heart sounds: Normal heart sounds. No murmur heard.    No gallop.  Pulmonary:     Effort: Pulmonary effort is normal. No respiratory distress.     Breath sounds: Normal breath sounds. No wheezing or rales.  Musculoskeletal:     Right lower leg: No edema.     Left lower leg: No edema.    Medications and allergies   Allergies  Allergen Reactions   Sulfa Antibiotics Rash   Pork-Derived Products      Medication list after today's encounter   Current Outpatient Medications:  azaTHIOprine (IMURAN) 50 MG tablet, TAKE 2 TABLETS(100 MG) BY MOUTH DAILY, Disp: 180 tablet, Rfl: 3   canagliflozin (INVOKANA) 100 MG TABS tablet, Take by mouth daily before breakfast., Disp: , Rfl:    metoprolol succinate (TOPROL-XL) 50 MG 24 hr tablet, Take 50 mg by mouth daily. Take with or immediately following a meal., Disp: , Rfl:    ofloxacin (OCUFLOX) 0.3 % ophthalmic solution, , Disp: , Rfl:    olmesartan (BENICAR) 40 MG tablet, Take 1 tablet by mouth daily., Disp: , Rfl:    rosuvastatin (CRESTOR) 10 MG tablet, Take 10 mg by mouth daily., Disp: , Rfl:    sitaGLIPtin (JANUVIA) 100 MG tablet, Take 100 mg by mouth daily., Disp: , Rfl:    THIAMINE HCL PO, Take 1 tablet by mouth daily., Disp: , Rfl:    traZODone (DESYREL) 50 MG tablet, Take 25 mg by mouth at bedtime., Disp: , Rfl:    amLODipine (NORVASC) 10 MG tablet, Take 1 tablet (10 mg total) by  mouth daily., Disp: 30 tablet, Rfl: 3   ONETOUCH VERIO test strip, , Disp: , Rfl:   Laboratory examination:   Lab Results  Component Value Date   NA 138 09/27/2021   K 4.3 09/27/2021   CO2 30 09/27/2021   GLUCOSE 143 (H) 09/27/2021   BUN 16 09/27/2021   CREATININE 0.89 09/27/2021   CALCIUM 9.4 09/27/2021   GFRNONAA 75 (L) 10/14/2013       Latest Ref Rng & Units 09/27/2021    9:12 AM 07/01/2020   10:40 AM 06/12/2017   11:28 AM  CMP  Glucose 70 - 99 mg/dL 143  211    BUN 6 - 23 mg/dL 16  18    Creatinine 0.40 - 1.20 mg/dL 0.89  0.90    Sodium 135 - 145 mEq/L 138  136    Potassium 3.5 - 5.1 mEq/L 4.3  4.0    Chloride 96 - 112 mEq/L 103  100    CO2 19 - 32 mEq/L 30  29    Calcium 8.4 - 10.5 mg/dL 9.4  9.5    Total Protein 6.0 - 8.3 g/dL 7.6  7.5  7.3   Total Bilirubin 0.2 - 1.2 mg/dL 0.4  0.5  0.4   Alkaline Phos 39 - 117 U/L 56  65  66   AST 0 - 37 U/L '20  18  26   '$ ALT 0 - 35 U/L '14  14  21       '$ Latest Ref Rng & Units 09/27/2021    9:12 AM 07/01/2020   10:40 AM 06/12/2017   11:28 AM  CBC  WBC 4.0 - 10.5 K/uL 5.7  6.5  6.2   Hemoglobin 12.0 - 15.0 g/dL 14.1  13.8  13.5   Hematocrit 36.0 - 46.0 % 41.6  40.8  39.2   Platelets 150.0 - 400.0 K/uL 174.0  174.0  221.0     Lipid Panel No results for input(s): "CHOL", "TRIG", "LDLCALC", "VLDL", "HDL", "CHOLHDL", "LDLDIRECT" in the last 8760 hours.  HEMOGLOBIN A1C Lab Results  Component Value Date   HGBA1C 8.5 (H) 10/14/2013   MPG 197 (H) 10/14/2013   TSH No results for input(s): "TSH" in the last 8760 hours.  External labs:     Radiology:    Cardiac Studies:   Lexiscan (with Mod Bruce protocol) Nuclear stress test 01/10/2022 Myocardial perfusion is normal. Overall LV systolic function is normal without regional wall motion abnormalities. Stress LV EF: 66%. Low  risk study. Nondiagnostic ECG stress. The heart rate response was consistent with Regadenoson. The blood pressure response was physiologic. No previous  exam available for comparison.  Echocardiogram 01/10/2022: Normal LV systolic function with visual EF 60-65%. Left ventricle cavity is normal in size. Mild concentric hypertrophy of the left ventricle. Normal global wall motion. Doppler evidence of grade II (pseudonormal) diastolic dysfunction, elevated LAP. Calculated EF 63%. Structurally normal tricuspid valve.  Mild tricuspid regurgitation. No evidence of pulmonary hypertension.  EKG:   12/09/2021 normal sinus rhythm with incomplete right bundle branch block, heart rate is 60, normal axis, normal R wave progression  Assessment     ICD-10-CM   1. Palpitations  R00.2     2. Atypical chest pain  R07.89     3. Primary hypertension  I10        No orders of the defined types were placed in this encounter.   Meds ordered this encounter  Medications   amLODipine (NORVASC) 10 MG tablet    Sig: Take 1 tablet (10 mg total) by mouth daily.    Dispense:  30 tablet    Refill:  3    Order Specific Question:   Supervising Provider    Answer:   Adrian Prows [[id number]]    Medications Discontinued During This Encounter  Medication Reason   escitalopram (LEXAPRO) 5 MG tablet    omeprazole (PRILOSEC) 10 MG capsule    prednisoLONE acetate (PRED FORTE) 1 % ophthalmic suspension Completed Course   ranitidine (ZANTAC) 75 MG tablet    pyridostigmine (MESTINON) 60 MG tablet    amLODipine (NORVASC) 5 MG tablet Reorder     Recommendations:   Cassidy Carlson is a 71 y.o. female with past medical history significant for hypertension, hyperlipidemia, and diabetes.  Palpitations She continues to have infrequent episodes of palpitations that usually occur when walking at a fast past or completing household chores. If palpitations become more frequent would consider placing cardiac event monitor.  At this time she does not feel that palpitations are bothersome or cause any other related symptoms. She continues on Metoprolol '50mg'$  daily, advised patient  that she can increase to '100mg'$  if palpitations worsen. Advised her to check blood pressure and heart rate if she increases to '100mg'$ .  Atypical chest pain She had not had recurrence of chest pain since last visit.  Primary hypertension Blood pressure remains elevated at today's visit. She does not monitor her blood pressure at home. Will increase Amlodipine to '10mg'$  daily. I have asked her to check home blood pressure and keep a log of readings. She will notify us if blood pressure remains elevated >140/80. If blood pressure continues to be uncontrolled would consider adding another agent. Dicussed important of low sodium diet, exercise, and weight loss.  Follow-up in 6 months or sooner if needed.    Ernst Spell, [place] Office: [phone] Pager: [phone]

## 2022-02-18 ENCOUNTER — Other Ambulatory Visit: Payer: Self-pay | Admitting: Neurology

## 2022-02-22 ENCOUNTER — Telehealth: Payer: Self-pay | Admitting: Neurology

## 2022-02-22 NOTE — Telephone Encounter (Signed)
Called Pharmacy and they reported that they have prescription but insurance will not pay for there part until January 22nd. Called pt and left detailed message to call office if questions.

## 2022-02-22 NOTE — Telephone Encounter (Signed)
Pt called in and left a message. She did not state the name of the medication, but her pharmacy is out of refills. [place] and needs 3 month supply at [facility]

## 2022-07-19 DIAGNOSIS — E119 Type 2 diabetes mellitus without complications: Secondary | ICD-10-CM | POA: Diagnosis not present

## 2022-07-19 DIAGNOSIS — Z961 Presence of intraocular lens: Secondary | ICD-10-CM | POA: Diagnosis not present

## 2022-07-19 DIAGNOSIS — H26491 Other secondary cataract, right eye: Secondary | ICD-10-CM | POA: Diagnosis not present

## 2022-07-19 DIAGNOSIS — H1045 Other chronic allergic conjunctivitis: Secondary | ICD-10-CM | POA: Diagnosis not present

## 2022-07-19 DIAGNOSIS — H04123 Dry eye syndrome of bilateral lacrimal glands: Secondary | ICD-10-CM | POA: Diagnosis not present

## 2022-07-25 ENCOUNTER — Ambulatory Visit: Payer: Medicare Other | Admitting: Internal Medicine

## 2022-07-25 VITALS — BP 152/72 | HR 67 | Ht 61.0 in | Wt 136.4 lb

## 2022-07-25 DIAGNOSIS — E119 Type 2 diabetes mellitus without complications: Secondary | ICD-10-CM | POA: Diagnosis not present

## 2022-07-25 DIAGNOSIS — E782 Mixed hyperlipidemia: Secondary | ICD-10-CM

## 2022-07-25 DIAGNOSIS — I10 Essential (primary) hypertension: Secondary | ICD-10-CM | POA: Diagnosis not present

## 2022-07-25 MED ORDER — METOPROLOL SUCCINATE ER 50 MG PO TB24: 75.0000 mg | ORAL_TABLET | Freq: Every day | ORAL | 3 refills | Status: AC

## 2022-07-25 NOTE — Progress Notes (Signed)
Primary Physician/Referring:  Chilton Greathouse, MD  [name], female    DOB: 04/17/1950, 72 y.o.   MRN: [id number]  Chief Complaint  Patient presents with   Palpitations   Follow-up    Sob on exertion    HPI:    Cassidy Carlson  is a 72 y.o. female with past medical history significant for hypertension, hyperlipidemia, and diabetes.  She presents today for follow-up visit. Her blood pressure remains elevated. She does not check her pressures at home like I advised her last time. I have again asked her to check her pressure daily and keep a log to bring in for her next visit. She does not want to add any medications. She is hesitant to double her Toprol and she only wants to increase her dose slowly. She is agreeable going up to 75 mg of toprol daily and then she will see next visit if she will go up to 100 mg daily. She denies chest pain, shortness of breath, leg edema, orthopnea, PND, syncope.  Past Medical History:  Diagnosis Date   Diabetes mellitus (HCC)    GERD (gastroesophageal reflux disease)    Hyperlipidemia    Hypertension    Myasthenia gravis (HCC) 11/04/2013   Past Surgical History:  Procedure Laterality Date   ABDOMINAL HYSTERECTOMY     Family History  Problem Relation Age of Onset   Heart disease Father        Deceased, 79   Hypertension Mother        Living, 60   Diabetes Sister        x 2   Healthy Brother    Healthy Son    Healthy Daughter     Social History   Tobacco Use   Smoking status: Never   Smokeless tobacco: Never  Substance Use Topics   Alcohol use: No    Alcohol/week: 0.0 standard drinks of alcohol   Marital Status: Married  ROS  Review of Systems  Cardiovascular:  Negative for chest pain, dyspnea on exertion, near-syncope, palpitations and syncope.  Neurological:  Negative for dizziness and light-headedness.   Objective  Blood pressure (!) 152/72, pulse 67, height 5\' 1"  (1.549 m), weight 136 lb 6.4 oz (61.9 kg),  SpO2 99 %. Body mass index is 25.77 kg/m.     07/25/2022   10:51 AM 01/25/2022   10:30 AM 12/09/2021    8:57 AM  Vitals with BMI  Height 5\' 1"  5\' 1"  5\' 1"   Weight 136 lbs 6 oz 139 lbs 6 oz 138 lbs  BMI 25.79 26.35 26.09  Systolic 152 146 213  Diastolic 72 71 73  Pulse 67 66 60     Physical Exam Vitals and nursing note reviewed.  Cardiovascular:     Rate and Rhythm: Normal rate and regular rhythm.     Pulses: Normal pulses.     Heart sounds: Normal heart sounds. No murmur heard.    No gallop.  Pulmonary:     Effort: Pulmonary effort is normal. No respiratory distress.     Breath sounds: Normal breath sounds. No wheezing or rales.  Musculoskeletal:     Right lower leg: No edema.     Left lower leg: No edema.    Medications and allergies   Allergies  Allergen Reactions   Sulfa Antibiotics Rash   Pork-Derived Products      Medication list after today's encounter   Current Outpatient Medications:    amLODipine (NORVASC) 10 MG tablet, Take 1  tablet (10 mg total) by mouth daily., Disp: 30 tablet, Rfl: 3   azaTHIOprine (IMURAN) 50 MG tablet, TAKE 2 TABLETS(100 MG) BY MOUTH DAILY, Disp: 180 tablet, Rfl: 2   canagliflozin (INVOKANA) 100 MG TABS tablet, Take by mouth daily before breakfast., Disp: , Rfl:    ofloxacin (OCUFLOX) 0.3 % ophthalmic solution, , Disp: , Rfl:    olmesartan (BENICAR) 40 MG tablet, Take 1 tablet by mouth daily., Disp: , Rfl:    ONETOUCH VERIO test strip, , Disp: , Rfl:    rosuvastatin (CRESTOR) 20 MG tablet, Take 20 mg by mouth daily., Disp: , Rfl:    sitaGLIPtin (JANUVIA) 100 MG tablet, Take 100 mg by mouth daily., Disp: , Rfl:    traZODone (DESYREL) 50 MG tablet, Take 25 mg by mouth at bedtime., Disp: , Rfl:    metoprolol succinate (TOPROL-XL) 50 MG 24 hr tablet, Take 1.5 tablets (75 mg total) by mouth at bedtime. Take with or immediately following a meal., Disp: 90 tablet, Rfl: 3  Laboratory examination:   Lab Results  Component Value Date   NA 138  09/27/2021   K 4.3 09/27/2021   CO2 30 09/27/2021   GLUCOSE 143 (H) 09/27/2021   BUN 16 09/27/2021   CREATININE 0.89 09/27/2021   CALCIUM 9.4 09/27/2021   GFRNONAA 75 (L) 10/14/2013       Latest Ref Rng & Units 09/27/2021    9:12 AM 07/01/2020   10:40 AM 06/12/2017   11:28 AM  CMP  Glucose 70 - 99 mg/dL 161  096    BUN 6 - 23 mg/dL 16  18    Creatinine 0.45 - 1.20 mg/dL 4.09  8.11    Sodium 914 - 145 mEq/L 138  136    Potassium 3.5 - 5.1 mEq/L 4.3  4.0    Chloride 96 - 112 mEq/L 103  100    CO2 19 - 32 mEq/L 30  29    Calcium 8.4 - 10.5 mg/dL 9.4  9.5    Total Protein 6.0 - 8.3 g/dL 7.6  7.5  7.3   Total Bilirubin 0.2 - 1.2 mg/dL 0.4  0.5  0.4   Alkaline Phos 39 - 117 U/L 56  65  66   AST 0 - 37 U/L 20  18  26    ALT 0 - 35 U/L 14  14  21        Latest Ref Rng & Units 09/27/2021    9:12 AM 07/01/2020   10:40 AM 06/12/2017   11:28 AM  CBC  WBC 4.0 - 10.5 K/uL 5.7  6.5  6.2   Hemoglobin 12.0 - 15.0 g/dL 78.2  95.6  21.3   Hematocrit 36.0 - 46.0 % 41.6  40.8  39.2   Platelets 150.0 - 400.0 K/uL 174.0  174.0  221.0     Lipid Panel No results for input(s): "CHOL", "TRIG", "LDLCALC", "VLDL", "HDL", "CHOLHDL", "LDLDIRECT" in the last 8760 hours.  HEMOGLOBIN A1C Lab Results  Component Value Date   HGBA1C 8.5 (H) 10/14/2013   MPG 197 (H) 10/14/2013   TSH No results for input(s): "TSH" in the last 8760 hours.  External labs:     Radiology:    Cardiac Studies:   Lexiscan (with Mod Bruce protocol) Nuclear stress test 01/10/2022 Myocardial perfusion is normal. Overall LV systolic function is normal without regional wall motion abnormalities. Stress LV EF: 66%. Low risk study. Nondiagnostic ECG stress. The heart rate response was consistent with Regadenoson. The blood pressure response  was physiologic. No previous exam available for comparison.  Echocardiogram 01/10/2022: Normal LV systolic function with visual EF 60-65%. Left ventricle cavity is normal in size. Mild  concentric hypertrophy of the left ventricle. Normal global wall motion. Doppler evidence of grade II (pseudonormal) diastolic dysfunction, elevated LAP. Calculated EF 63%. Structurally normal tricuspid valve.  Mild tricuspid regurgitation. No evidence of pulmonary hypertension.  EKG:   12/09/2021 normal sinus rhythm with incomplete right bundle branch block, heart rate is 60, normal axis, normal R wave progression  Assessment     ICD-10-CM   1. Essential hypertension  I10     2. Mixed hyperlipidemia  E78.2     3. Type 2 diabetes mellitus without complication, without long-term current use of insulin (HCC)  E11.9         No orders of the defined types were placed in this encounter.   Meds ordered this encounter  Medications   metoprolol succinate (TOPROL-XL) 50 MG 24 hr tablet    Sig: Take 1.5 tablets (75 mg total) by mouth at bedtime. Take with or immediately following a meal.    Dispense:  90 tablet    Refill:  3    Medications Discontinued During This Encounter  Medication Reason   THIAMINE HCL PO    rosuvastatin (CRESTOR) 10 MG tablet Dose change   metoprolol succinate (TOPROL-XL) 50 MG 24 hr tablet Reorder     Recommendations:   Cassidy Carlson is a 72 y.o. female with past medical history significant for hypertension, hyperlipidemia, and diabetes.  Diabetes Managed by primary  Mixed hyperlipidemia Continue Crestor 20 mg PCP is following lipids  Primary hypertension Blood pressure remains elevated at today's visit. She does not monitor her blood pressure at home. I have asked her many times to keep and bring a BP log to our office visit. She says she will do it this time. Last visit I increased her Amlodipine to 10mg  daily. She is also on omelsartan 40 mg. She is hesitant to increase medication doses. She does not want to double her Toprol at this time and she would like to increase it slowly. She is agreeable to taking 75 mg of Toprol at nighttime before next  visit and then she will consider increasing to 100 mg as I recommended. Dicussed important of low sodium diet, exercise, and weight loss.  Follow-up in 6 months or sooner if needed.    Clotilde Dieter, DO Office: [phone] Pager: [phone]

## 2022-07-27 ENCOUNTER — Ambulatory Visit: Payer: Medicare Other

## 2022-07-27 ENCOUNTER — Ambulatory Visit: Payer: Medicare Other | Admitting: Internal Medicine

## 2022-09-28 ENCOUNTER — Encounter: Payer: Self-pay | Admitting: Neurology

## 2022-09-28 ENCOUNTER — Ambulatory Visit (INDEPENDENT_AMBULATORY_CARE_PROVIDER_SITE_OTHER): Payer: Medicare Other | Admitting: Neurology

## 2022-09-28 VITALS — BP 154/74 | HR 62 | Ht 62.0 in | Wt 136.0 lb

## 2022-09-28 DIAGNOSIS — G7000 Myasthenia gravis without (acute) exacerbation: Secondary | ICD-10-CM | POA: Diagnosis not present

## 2022-09-28 NOTE — Patient Instructions (Signed)
It was great to see you today!  Continue azathioprine 100mg  daily  I will see you back in 1 year

## 2022-09-28 NOTE — Progress Notes (Signed)
Follow-up Visit   Date: 09/28/22   Cassidy Carlson MRN: [id number] DOB: September 30, 1950   Interim History: Cassidy Carlson is a 72 y.o. right-handed Bengali female with history of diabetes mellitus (diagnosed 2014), hyperlipidemia, GERD and hypertension returning for follow-up of seronegative myasthenia gravis.  She is accompanied by her husband.   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  Last relapse was in 2015 with initial diagnosis.  She has been well-controlled on azathioprine 100mg /d.  No evidence of disease manifestation.  - Continue azathioprine 100mg  daily  - CBC and CMP can be checked next month when she goes for annual exam with PCP  Return to clinic in 1 year  ------------------------------------------------ UPDATE 09/28/2022:  She is here for 1 year follow-up.  She has been doing great with no double vision, facial weakness, or limb weakness.  She is very compliant with azathioprine 100mg /d.  She has not been taking mestinon.  She reports having a dry cough and will be seeing her PCP.    Medications:  Current Outpatient Medications on File Prior to Visit  Medication Sig Dispense Refill   amLODipine (NORVASC) 10 MG tablet Take 1 tablet (10 mg total) by mouth daily. 30 tablet 3   azaTHIOprine (IMURAN) 50 MG tablet TAKE 2 TABLETS(100 MG) BY MOUTH DAILY 180 tablet 2   canagliflozin (INVOKANA) 100 MG TABS tablet Take by mouth daily before breakfast.     metoprolol succinate (TOPROL-XL) 50 MG 24 hr tablet Take 1.5 tablets (75 mg total) by mouth at bedtime. Take with or immediately following a meal. (Patient taking differently: Take 50 mg by mouth at bedtime. Take with or immediately following a meal.) 90 tablet 3   ofloxacin (OCUFLOX) 0.3 % ophthalmic solution      olmesartan (BENICAR) 40 MG tablet Take 1 tablet by mouth daily.     ONETOUCH VERIO test strip      rosuvastatin (CRESTOR) 20 MG tablet Take  20 mg by mouth daily.     sitaGLIPtin (JANUVIA) 100 MG tablet Take 100 mg by mouth daily.     traZODone (DESYREL) 50 MG tablet Take 25 mg by mouth at bedtime.     No current facility-administered medications on file prior to visit.    Allergies:  Allergies  Allergen Reactions   Sulfa Antibiotics Rash   Pork-Derived Products      Vital Signs:  BP (!) 154/74   Pulse 62   Ht 5\' 2"  (1.575 m)   Wt 136 lb (61.7 kg)   SpO2 96%   BMI 24.87 kg/m    Neurological Exam: MENTAL STATUS including orientation to time, place, person, recent and remote memory, attention span and concentration, language, and fund of knowledge is normal.  Speech is not dysarthric.  CRANIAL NERVES:   Pupils equal round and reactive to light.  Extraocular muscles intact, mild esotropia of the left eye, no ptosis.  Orbicularis oculi and orbicularis oris are 5/5.  Buccinator is 5/5.  Tongue strength is 5/5.  MOTOR:  Motor strength is 5/5 in all extremities.  COORDINATION/GAIT:  Gait narrow based and stable.   Data: Lab Results  Component Value Date   ALT 14 09/27/2021   AST 20 09/27/2021   ALKPHOS 56 09/27/2021   BILITOT 0.4 09/27/2021   Lab Results  Component Value Date   WBC 5.7 09/27/2021   HGB 14.1 09/27/2021   HCT 41.6 09/27/2021   MCV 91.4 09/27/2021   PLT 174.0 09/27/2021  Myasthenia AChR binding, blocking, and modulating antibody 10/13/2013: Negative MRI brain 10/13/2013: Mild chronic microvascular ischemic change. No acute abnormality.   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 Right median neuropathy, at or distal to the wrist, consistent with the clinical diagnosis of carpal tunnel syndrome. Overall, these findings are mild to moderate in degree electrically.  CT Chest 10/30/2013: No acute finding or finding to explain the patient's symptoms.  Diffuse fatty infiltration of the liver. Subtle nodularity of  the liver border is compatible with cirrhosis.  Calcific aortic and coronary atherosclerosis.     Thank you for allowing me to participate in patient's care.  If I can answer any additional questions, I would be pleased to do so.    Sincerely,    Zion Ta K. Allena Katz, DO

## 2022-09-30 DIAGNOSIS — G7001 Myasthenia gravis with (acute) exacerbation: Secondary | ICD-10-CM | POA: Diagnosis not present

## 2022-09-30 DIAGNOSIS — E1165 Type 2 diabetes mellitus with hyperglycemia: Secondary | ICD-10-CM | POA: Diagnosis not present

## 2022-09-30 DIAGNOSIS — K219 Gastro-esophageal reflux disease without esophagitis: Secondary | ICD-10-CM | POA: Diagnosis not present

## 2022-09-30 DIAGNOSIS — I10 Essential (primary) hypertension: Secondary | ICD-10-CM | POA: Diagnosis not present

## 2022-10-06 ENCOUNTER — Other Ambulatory Visit: Payer: Self-pay | Admitting: Neurology

## 2022-11-09 DIAGNOSIS — G7001 Myasthenia gravis with (acute) exacerbation: Secondary | ICD-10-CM | POA: Diagnosis not present

## 2022-11-09 DIAGNOSIS — I10 Essential (primary) hypertension: Secondary | ICD-10-CM | POA: Diagnosis not present

## 2022-11-09 DIAGNOSIS — K219 Gastro-esophageal reflux disease without esophagitis: Secondary | ICD-10-CM | POA: Diagnosis not present

## 2022-11-09 DIAGNOSIS — E1165 Type 2 diabetes mellitus with hyperglycemia: Secondary | ICD-10-CM | POA: Diagnosis not present

## 2022-11-21 DIAGNOSIS — I10 Essential (primary) hypertension: Secondary | ICD-10-CM | POA: Diagnosis not present

## 2022-11-21 DIAGNOSIS — Z0001 Encounter for general adult medical examination with abnormal findings: Secondary | ICD-10-CM | POA: Diagnosis not present

## 2022-11-21 DIAGNOSIS — E785 Hyperlipidemia, unspecified: Secondary | ICD-10-CM | POA: Diagnosis not present

## 2022-11-21 DIAGNOSIS — E1165 Type 2 diabetes mellitus with hyperglycemia: Secondary | ICD-10-CM | POA: Diagnosis not present

## 2022-11-28 DIAGNOSIS — K219 Gastro-esophageal reflux disease without esophagitis: Secondary | ICD-10-CM | POA: Diagnosis not present

## 2022-11-28 DIAGNOSIS — E1165 Type 2 diabetes mellitus with hyperglycemia: Secondary | ICD-10-CM | POA: Diagnosis not present

## 2022-11-28 DIAGNOSIS — G7001 Myasthenia gravis with (acute) exacerbation: Secondary | ICD-10-CM | POA: Diagnosis not present

## 2022-11-28 DIAGNOSIS — Z0000 Encounter for general adult medical examination without abnormal findings: Secondary | ICD-10-CM | POA: Diagnosis not present

## 2022-11-28 DIAGNOSIS — I10 Essential (primary) hypertension: Secondary | ICD-10-CM | POA: Diagnosis not present

## 2022-11-28 DIAGNOSIS — Z1331 Encounter for screening for depression: Secondary | ICD-10-CM | POA: Diagnosis not present

## 2022-11-28 DIAGNOSIS — E785 Hyperlipidemia, unspecified: Secondary | ICD-10-CM | POA: Diagnosis not present

## 2022-11-28 DIAGNOSIS — F39 Unspecified mood [affective] disorder: Secondary | ICD-10-CM | POA: Diagnosis not present

## 2022-11-28 DIAGNOSIS — Z23 Encounter for immunization: Secondary | ICD-10-CM | POA: Diagnosis not present

## 2022-11-28 DIAGNOSIS — E669 Obesity, unspecified: Secondary | ICD-10-CM | POA: Diagnosis not present

## 2022-11-28 DIAGNOSIS — Z1339 Encounter for screening examination for other mental health and behavioral disorders: Secondary | ICD-10-CM | POA: Diagnosis not present

## 2022-11-28 DIAGNOSIS — R82998 Other abnormal findings in urine: Secondary | ICD-10-CM | POA: Diagnosis not present

## 2022-11-28 DIAGNOSIS — M8580 Other specified disorders of bone density and structure, unspecified site: Secondary | ICD-10-CM | POA: Diagnosis not present

## 2023-01-02 DIAGNOSIS — Z23 Encounter for immunization: Secondary | ICD-10-CM | POA: Diagnosis not present

## 2023-01-25 ENCOUNTER — Ambulatory Visit: Payer: Medicare Other | Attending: Cardiology | Admitting: Cardiology

## 2023-01-25 NOTE — Progress Notes (Unsigned)
  Cardiology Office Note:  .   Date:  01/25/2023  ID:  Cassidy Carlson, DOB 1951/01/10, MRN [id number] PCP: Chilton Greathouse, MD  [facility] Providers Cardiologist:  Truett Mainland, MD PCP: Chilton Greathouse, MD  No chief complaint on file.     History of Present Illness: .    Cassidy Carlson is a 72 y.o. female with hypertension, hyperlipidemia, type 2 DM, myasthenia gravis  There were no vitals filed for this visit.   ROS: *** ROS   Studies Reviewed: .        *** Lexiscan (with Mod Bruce protocol) Nuclear stress test 01/10/2022: Myocardial perfusion is normal. Overall LV systolic function is normal without regional wall motion abnormalities. Stress LV EF: 66%. Low risk study. Nondiagnostic ECG stress. The heart rate response was consistent with Regadenoson. The blood pressure response was physiologic. No previous exam available for comparison.  Echocardiogram 01/10/2022: Normal LV systolic function with visual EF 60-65%. Left ventricle cavity is normal in size. Mild concentric hypertrophy of the left ventricle. Normal global wall motion. Doppler evidence of grade II (pseudonormal) diastolic dysfunction, elevated LAP. Calculated EF 63%. Structurally normal tricuspid valve.  Mild tricuspid regurgitation. No evidence of pulmonary hypertension.  Risk Assessment/Calculations:   {Does this patient have ATRIAL FIBRILLATION?:[id number]}   Physical Exam:   Physical Exam   VISIT DIAGNOSES: No diagnosis found.   ASSESSMENT AND PLAN: .    Cassidy Carlson is a 72 y.o. female with ***  {Are you ordering a CV Procedure (e.g. stress test, cath, DCCV, TEE, etc)?   Press F2        :[id number]}    No orders of the defined types were placed in this encounter.    F/u in ***  Signed, Elder Negus, MD

## 2023-01-26 ENCOUNTER — Encounter: Payer: Self-pay | Admitting: Cardiology

## 2023-07-10 DIAGNOSIS — M8580 Other specified disorders of bone density and structure, unspecified site: Secondary | ICD-10-CM | POA: Diagnosis not present

## 2023-07-10 DIAGNOSIS — E669 Obesity, unspecified: Secondary | ICD-10-CM | POA: Diagnosis not present

## 2023-07-10 DIAGNOSIS — G7001 Myasthenia gravis with (acute) exacerbation: Secondary | ICD-10-CM | POA: Diagnosis not present

## 2023-07-10 DIAGNOSIS — K219 Gastro-esophageal reflux disease without esophagitis: Secondary | ICD-10-CM | POA: Diagnosis not present

## 2023-07-10 DIAGNOSIS — I10 Essential (primary) hypertension: Secondary | ICD-10-CM | POA: Diagnosis not present

## 2023-07-10 DIAGNOSIS — G479 Sleep disorder, unspecified: Secondary | ICD-10-CM | POA: Diagnosis not present

## 2023-07-10 DIAGNOSIS — R002 Palpitations: Secondary | ICD-10-CM | POA: Diagnosis not present

## 2023-07-10 DIAGNOSIS — F39 Unspecified mood [affective] disorder: Secondary | ICD-10-CM | POA: Diagnosis not present

## 2023-07-10 DIAGNOSIS — E1165 Type 2 diabetes mellitus with hyperglycemia: Secondary | ICD-10-CM | POA: Diagnosis not present

## 2023-07-10 DIAGNOSIS — E785 Hyperlipidemia, unspecified: Secondary | ICD-10-CM | POA: Diagnosis not present

## 2023-09-21 DIAGNOSIS — H26493 Other secondary cataract, bilateral: Secondary | ICD-10-CM | POA: Diagnosis not present

## 2023-09-21 DIAGNOSIS — Z961 Presence of intraocular lens: Secondary | ICD-10-CM | POA: Diagnosis not present

## 2023-09-21 DIAGNOSIS — E119 Type 2 diabetes mellitus without complications: Secondary | ICD-10-CM | POA: Diagnosis not present

## 2023-09-21 DIAGNOSIS — H04123 Dry eye syndrome of bilateral lacrimal glands: Secondary | ICD-10-CM | POA: Diagnosis not present

## 2023-09-21 DIAGNOSIS — H1045 Other chronic allergic conjunctivitis: Secondary | ICD-10-CM | POA: Diagnosis not present

## 2023-09-26 ENCOUNTER — Other Ambulatory Visit: Payer: Self-pay | Admitting: Neurology

## 2023-10-02 ENCOUNTER — Ambulatory Visit (INDEPENDENT_AMBULATORY_CARE_PROVIDER_SITE_OTHER): Payer: Medicare Other | Admitting: Neurology

## 2023-10-02 ENCOUNTER — Encounter: Payer: Self-pay | Admitting: Neurology

## 2023-10-02 ENCOUNTER — Other Ambulatory Visit

## 2023-10-02 VITALS — BP 142/64 | HR 64 | Ht 62.0 in | Wt 147.0 lb

## 2023-10-02 DIAGNOSIS — G7000 Myasthenia gravis without (acute) exacerbation: Secondary | ICD-10-CM | POA: Diagnosis not present

## 2023-10-02 DIAGNOSIS — Z5181 Encounter for therapeutic drug level monitoring: Secondary | ICD-10-CM

## 2023-10-02 NOTE — Progress Notes (Signed)
 Follow-up Visit   Date: 10/02/23   Cassidy Carlson MRN: [id number] DOB: 1950-03-26   Interim History: Cassidy Carlson is a 73 y.o. right-handed Bengali female with history of diabetes mellitus (diagnosed 2014), hyperlipidemia, GERD and hypertension returning for follow-up of seronegative myasthenia gravis.  She is accompanied by her husband.   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  She has been well-controlled since diagnosis with no relapse on azathioprine  100mg /d.  Today, she has no symptomology and no evidence of disease manifestation.  - Continue azathioprine  100mg  daily  - Check CBC and CMP  Right shoulder and hip pain  - Follow-up with PCP  Return to clinic in 1 year  ------------------------------------------------ UPDATE 09/28/2022:  She is here for 1 year follow-up.  She has been doing great with no double vision, facial weakness, or limb weakness.  She is very compliant with azathioprine  100mg /d.  She has not been taking mestinon .  She reports having a dry cough and will be seeing her PCP.   UPDATE 10/02/2023:  She is here for follow-up visit.  Myasthenia gravis is well-controlled and she denies double vision, facial weakness, or limb weakness.  She is compliant with azathioprine  100mg /d.    She has achy right leg and hip pain which is worse with walking.  She denies numbness, tingling, or weakness.  She also has right shoulder pain and reports having injection for this many years ago.    Medications:  Current Outpatient Medications on File Prior to Visit  Medication Sig Dispense Refill   amLODipine  (NORVASC ) 10 MG tablet Take 1 tablet (10 mg total) by mouth daily. 30 tablet 3   azaTHIOprine  (IMURAN ) 50 MG tablet TAKE 2 TABLETS(100 MG) BY MOUTH DAILY 180 tablet 3   canagliflozin (INVOKANA) 100 MG TABS tablet Take by mouth daily before breakfast.     metoprolol  succinate (TOPROL -XL) 50 MG 24  hr tablet Take 1.5 tablets (75 mg total) by mouth at bedtime. Take with or immediately following a meal. 90 tablet 3   ofloxacin (OCUFLOX) 0.3 % ophthalmic solution      olmesartan  (BENICAR ) 40 MG tablet Take 1 tablet by mouth daily.     ONETOUCH VERIO test strip      pioglitazone (ACTOS) 30 MG tablet Take 30 mg by mouth daily.     rosuvastatin (CRESTOR) 20 MG tablet Take 20 mg by mouth daily.     traZODone (DESYREL) 50 MG tablet Take 25 mg by mouth at bedtime.     sitaGLIPtin (JANUVIA) 100 MG tablet Take 100 mg by mouth daily. (Patient not taking: Reported on 10/02/2023)     No current facility-administered medications on file prior to visit.    Allergies:  Allergies  Allergen Reactions   Sulfa Antibiotics Rash   Pork-Derived Products      Vital Signs:  BP (!) 142/64   Pulse 64   Ht 5' 2 (1.575 m)   Wt 147 lb (66.7 kg)   SpO2 97%   BMI 26.89 kg/m    Neurological Exam: MENTAL STATUS including orientation to time, place, person, recent and remote memory, attention span and concentration, language, and fund of knowledge is normal.  Speech is not dysarthric.  CRANIAL NERVES:   Pupils equal round and reactive to light.  Extraocular muscles intact, mild esotropia of the left eye, no ptosis.  Orbicularis oculi and orbicularis oris are 5/5.  Buccinator is 5/5.  Tongue strength is 5/5.  MOTOR:  Motor strength is  5/5 in all extremities.  REFLEXES:  Reflexes are 2+/4 throughout.   COORDINATION/GAIT:  Gait narrow based and stable.   Data: Lab Results  Component Value Date   ALT 14 09/27/2021   AST 20 09/27/2021   ALKPHOS 56 09/27/2021   BILITOT 0.4 09/27/2021   Lab Results  Component Value Date   WBC 5.7 09/27/2021   HGB 14.1 09/27/2021   HCT 41.6 09/27/2021   MCV 91.4 09/27/2021   PLT 174.0 09/27/2021    Myasthenia AChR binding, blocking, and modulating antibody 10/13/2013: Negative MRI brain 10/13/2013: Mild chronic microvascular ischemic change. No acute abnormality.    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 Right median neuropathy, at or distal to the wrist, consistent with the clinical diagnosis of carpal tunnel syndrome. Overall, these findings are mild to moderate in degree electrically.  CT Chest 10/30/2013: No acute finding or finding to explain the patient's symptoms.  Diffuse fatty infiltration of the liver. Subtle nodularity of the liver border is compatible with cirrhosis.  Calcific aortic and coronary atherosclerosis.     Thank you for allowing me to participate in patient's care.  If I can answer any additional questions, I would be pleased to do so.    Sincerely,    Kahlee Metivier K. Tobie, DO

## 2023-10-03 ENCOUNTER — Ambulatory Visit: Payer: Self-pay | Admitting: Neurology

## 2023-10-03 LAB — COMPREHENSIVE METABOLIC PANEL WITH GFR
AG Ratio: 1.6 (calc) (ref 1.0–2.5)
ALT: 16 U/L (ref 6–29)
AST: 21 U/L (ref 10–35)
Albumin: 4.5 g/dL (ref 3.6–5.1)
Alkaline phosphatase (APISO): 58 U/L (ref 37–153)
BUN: 21 mg/dL (ref 7–25)
CO2: 30 mmol/L (ref 20–32)
Calcium: 9.7 mg/dL (ref 8.6–10.4)
Chloride: 100 mmol/L (ref 98–110)
Creat: 0.95 mg/dL (ref 0.60–1.00)
Globulin: 2.8 g/dL (ref 1.9–3.7)
Glucose, Bld: 204 mg/dL — ABNORMAL HIGH (ref 65–99)
Potassium: 4.8 mmol/L (ref 3.5–5.3)
Sodium: 136 mmol/L (ref 135–146)
Total Bilirubin: 0.4 mg/dL (ref 0.2–1.2)
Total Protein: 7.3 g/dL (ref 6.1–8.1)
eGFR: 63 mL/min/1.73m2 (ref >=60)

## 2023-10-03 LAB — CBC
HCT: 41.4 % (ref 35.0–45.0)
Hemoglobin: 13.3 g/dL (ref 11.7–15.5)
MCH: 30.9 pg (ref 27.0–33.0)
MCHC: 32.1 g/dL (ref 32.0–36.0)
MCV: 96.3 fL (ref 80.0–100.0)
MPV: 11.1 fL (ref 7.5–12.5)
Platelets: 198 Thousand/uL (ref 140–400)
RBC: 4.3 Million/uL (ref 3.80–5.10)
RDW: 13.3 % (ref 11.0–15.0)
WBC: 5 Thousand/uL (ref 3.8–10.8)

## 2023-11-28 DIAGNOSIS — Z1212 Encounter for screening for malignant neoplasm of rectum: Secondary | ICD-10-CM | POA: Diagnosis not present

## 2023-11-28 DIAGNOSIS — E1165 Type 2 diabetes mellitus with hyperglycemia: Secondary | ICD-10-CM | POA: Diagnosis not present

## 2023-11-28 DIAGNOSIS — E785 Hyperlipidemia, unspecified: Secondary | ICD-10-CM | POA: Diagnosis not present

## 2023-11-28 DIAGNOSIS — I10 Essential (primary) hypertension: Secondary | ICD-10-CM | POA: Diagnosis not present

## 2023-11-28 DIAGNOSIS — M8580 Other specified disorders of bone density and structure, unspecified site: Secondary | ICD-10-CM | POA: Diagnosis not present

## 2023-11-28 DIAGNOSIS — E7849 Other hyperlipidemia: Secondary | ICD-10-CM | POA: Diagnosis not present

## 2023-12-06 DIAGNOSIS — Z1331 Encounter for screening for depression: Secondary | ICD-10-CM | POA: Diagnosis not present

## 2023-12-06 DIAGNOSIS — E785 Hyperlipidemia, unspecified: Secondary | ICD-10-CM | POA: Diagnosis not present

## 2023-12-06 DIAGNOSIS — M8580 Other specified disorders of bone density and structure, unspecified site: Secondary | ICD-10-CM | POA: Diagnosis not present

## 2023-12-06 DIAGNOSIS — Z0000 Encounter for general adult medical examination without abnormal findings: Secondary | ICD-10-CM | POA: Diagnosis not present

## 2023-12-06 DIAGNOSIS — G7001 Myasthenia gravis with (acute) exacerbation: Secondary | ICD-10-CM | POA: Diagnosis not present

## 2023-12-06 DIAGNOSIS — R82998 Other abnormal findings in urine: Secondary | ICD-10-CM | POA: Diagnosis not present

## 2023-12-06 DIAGNOSIS — F419 Anxiety disorder, unspecified: Secondary | ICD-10-CM | POA: Diagnosis not present

## 2023-12-06 DIAGNOSIS — G479 Sleep disorder, unspecified: Secondary | ICD-10-CM | POA: Diagnosis not present

## 2023-12-06 DIAGNOSIS — E669 Obesity, unspecified: Secondary | ICD-10-CM | POA: Diagnosis not present

## 2023-12-06 DIAGNOSIS — I10 Essential (primary) hypertension: Secondary | ICD-10-CM | POA: Diagnosis not present

## 2023-12-06 DIAGNOSIS — K219 Gastro-esophageal reflux disease without esophagitis: Secondary | ICD-10-CM | POA: Diagnosis not present

## 2023-12-06 DIAGNOSIS — F39 Unspecified mood [affective] disorder: Secondary | ICD-10-CM | POA: Diagnosis not present

## 2023-12-06 DIAGNOSIS — E1165 Type 2 diabetes mellitus with hyperglycemia: Secondary | ICD-10-CM | POA: Diagnosis not present

## 2023-12-06 DIAGNOSIS — Z1339 Encounter for screening examination for other mental health and behavioral disorders: Secondary | ICD-10-CM | POA: Diagnosis not present

## 2023-12-06 DIAGNOSIS — R002 Palpitations: Secondary | ICD-10-CM | POA: Diagnosis not present

## 2024-01-16 ENCOUNTER — Encounter: Payer: Self-pay | Admitting: Cardiology

## 2024-01-16 ENCOUNTER — Ambulatory Visit: Attending: Cardiology | Admitting: Cardiology

## 2024-01-16 VITALS — BP 148/98 | HR 54 | Ht 62.5 in | Wt 143.0 lb

## 2024-01-16 DIAGNOSIS — E119 Type 2 diabetes mellitus without complications: Secondary | ICD-10-CM | POA: Insufficient documentation

## 2024-01-16 DIAGNOSIS — I10 Essential (primary) hypertension: Secondary | ICD-10-CM | POA: Insufficient documentation

## 2024-01-16 DIAGNOSIS — E782 Mixed hyperlipidemia: Secondary | ICD-10-CM | POA: Diagnosis not present

## 2024-01-16 DIAGNOSIS — R002 Palpitations: Secondary | ICD-10-CM | POA: Diagnosis not present

## 2024-01-16 MED ORDER — SPIRONOLACTONE 25 MG PO TABS
25.0000 mg | ORAL_TABLET | Freq: Every day | ORAL | 3 refills | Status: AC
Start: 2024-01-16 — End: 2024-04-15

## 2024-01-16 NOTE — Progress Notes (Signed)
 Cardiology Office Note:  .   ID:  Cassidy Carlson, DOB [birth year], MRN [id number] PCP:  Avva, Ravisankar, MD  Former Cardiology Providers: Dr. Annalee Casa [facility] Providers Cardiologist:  Madonna Large, DO , [facility] (established care 01/16/24) Electrophysiologist:  None  Click to update primary MD,subspecialty MD or APP then REFRESH:1}    Chief Complaint  Patient presents with   Follow-up    Hypertension and bradycardia    History of Present Illness: .   Cassidy Carlson is a 73 y.o.  female whose past medical history and cardiovascular risk factors includes: Hypertension, hyperlipidemia, diabetes, myasthenia gravis, advanced age.   Formally under the care of Dr. Annalee Custovic who last saw Cassidy Carlson back in August 21, 2022. I am seeing her for the first time to re-establishing care.   Based on last progress note Dr. Annalee Casa was helping the patient with blood pressure management.  She has had recent blood pressure readings around 159-160/65-70 mmHg. She previously monitored twice daily but has not done so recently. She notes dizziness that she attributes to her medications. She denies headache or syncope.  Her current medications for blood pressure include metoprolol  75 mg in the morning, propranolol 20 mg in the morning and 10 mg at night, olmesartan  40 mg in the morning, and amlodipine  10 mg in the evening. She also takes trazodone 25 mg at night, Invokana, pioglitazone, and rosuvastatin.  She has myasthenia gravis treated with Imuran . She has a sulfa allergy causing itching and hives. She follows a low-salt diet and reports occasional snoring but has not been evaluated for sleep apnea.  Review of Systems: .   Review of Systems  Cardiovascular:  Negative for chest pain, claudication, irregular heartbeat, leg swelling, near-syncope, orthopnea, palpitations, paroxysmal nocturnal dyspnea and syncope.  Respiratory:  Negative for shortness of breath.    Hematologic/Lymphatic: Negative for bleeding problem.  Neurological:  Positive for dizziness.    Studies Reviewed:   EKG: EKG Interpretation Date/Time:  Tuesday January 16 2024 09:24:12 EST Ventricular Rate:  54 PR Interval:  196 QRS Duration:  80 QT Interval:  428 QTC Calculation: 405 R Axis:   85  Text Interpretation: Sinus bradycardia When compared with ECG of 13-Oct-2013 14:40, No significant change since last tracing Confirmed by Large Madonna [phone]) on 01/16/2024 9:30:30 AM  Echocardiogram: 01/10/2022: Normal LV systolic function with visual EF 60-65%. Left ventricle cavity is normal in size. Mild concentric hypertrophy of the left ventricle. Normal global wall motion. Doppler evidence of grade II (pseudonormal) diastolic dysfunction, elevated LAP. Calculated EF 63%. Structurally normal tricuspid valve.  Mild tricuspid regurgitation. No evidence of pulmonary hypertension.  Stress Testing: Lexiscan  (with Mod Bruce protocol) Nuclear stress test 01/10/2022 Myocardial perfusion is normal. Overall LV systolic function is normal without regional wall motion abnormalities. Stress LV EF: 66%. Low risk study.    RADIOLOGY: N/A  Risk Assessment/Calculations:   NA   Labs:       Latest Ref Rng & Units 10/02/2023    2:19 PM 09/27/2021    9:12 AM 07/01/2020   10:40 AM  CBC  WBC 3.8 - 10.8 Thousand/uL 5.0  5.7  6.5   Hemoglobin 11.7 - 15.5 g/dL 86.6  85.8  86.1   Hematocrit 35.0 - 45.0 % 41.4  41.6  40.8   Platelets 140 - 400 Thousand/uL 198  174.0  174.0        Latest Ref Rng & Units 10/02/2023    2:19 PM 09/27/2021    9:12  AM 07/01/2020   10:40 AM  BMP  Glucose 65 - 99 mg/dL 795  856  788   BUN 7 - 25 mg/dL 21  16  18    Creatinine 0.60 - 1.00 mg/dL 9.04  9.10  9.09   BUN/Creat Ratio 6 - 22 (calc) SEE NOTE:     Sodium 135 - 146 mmol/L 136  138  136   Potassium 3.5 - 5.3 mmol/L 4.8  4.3  4.0   Chloride 98 - 110 mmol/L 100  103  100   CO2 20 - 32 mmol/L 30  30  29     Calcium  8.6 - 10.4 mg/dL 9.7  9.4  9.5       Latest Ref Rng & Units 10/02/2023    2:19 PM 09/27/2021    9:12 AM 07/01/2020   10:40 AM  CMP  Glucose 65 - 99 mg/dL 795  856  788   BUN 7 - 25 mg/dL 21  16  18    Creatinine 0.60 - 1.00 mg/dL 9.04  9.10  9.09   Sodium 135 - 146 mmol/L 136  138  136   Potassium 3.5 - 5.3 mmol/L 4.8  4.3  4.0   Chloride 98 - 110 mmol/L 100  103  100   CO2 20 - 32 mmol/L 30  30  29    Calcium  8.6 - 10.4 mg/dL 9.7  9.4  9.5   Total Protein 6.1 - 8.1 g/dL 7.3  7.6  7.5   Total Bilirubin 0.2 - 1.2 mg/dL 0.4  0.4  0.5   Alkaline Phos 39 - 117 U/L  56  65   AST 10 - 35 U/L 21  20  18    ALT 6 - 29 U/L 16  14  14      Lab Results  Component Value Date   CHOL 165 10/14/2013   HDL 33 (L) 10/14/2013   LDLCALC 97 10/14/2013   TRIG 175 (H) 10/14/2013   CHOLHDL 5.0 10/14/2013   No results for input(s): LIPOA in the last 8760 hours. No components found for: NTPROBNP No results for input(s): PROBNP in the last 8760 hours. No results for input(s): TSH in the last 8760 hours.  Physical Exam:    Today's Vitals   01/16/24 0921  BP: (!) 148/98  Pulse: (!) 54  SpO2: 99%  Weight: 143 lb (64.9 kg)  Height: 5' 2.5 (1.588 m)   Body mass index is 25.74 kg/m. Wt Readings from Last 3 Encounters:  01/16/24 143 lb (64.9 kg)  10/02/23 147 lb (66.7 kg)  09/28/22 136 lb (61.7 kg)    Physical Exam  Constitutional: No distress.  hemodynamically stable  Neck: No JVD present.  Cardiovascular: Regular rhythm, S1 normal and S2 normal. Bradycardia present. Exam reveals no gallop, no S3 and no S4.  No murmur heard. Pulmonary/Chest: Effort normal and breath sounds normal. No stridor. She has no wheezes. She has no rales.  Musculoskeletal:        General: No edema.     Cervical back: Neck supple.  Skin: Skin is warm.     Impression & Recommendation(s):  Impression:   ICD-10-CM   1. Essential hypertension  I10 EKG 12-Lead    spironolactone  (ALDACTONE ) 25 MG  tablet    Basic metabolic panel with GFR    2. Palpitations  R00.2 EKG 12-Lead    3. Type 2 diabetes mellitus without complication, without long-term current use of insulin  (HCC)  E11.9     4. Mixed hyperlipidemia  E78.2        Recommendation(s):  Essential hypertension Blood pressures are not well-controlled. Encouraged her to start checking blood pressures at home regularly to better understand her trends She is on Toprol -XL 75 mg at bedtime as well as propranolol 20 mg in the morning and 10 mg at night. She endorses symptoms of bradycardia as well. She has been on metoprolol  for many years according to her husband. Recommend weaning off of Toprol -XL as there is no need to be on 2 beta-blocker therapies. Week 1 and 2: Toprol -XL 25 mg in the morning. Week 3 and 4: Toprol -XL 25 mg every other day. Week 5 stop Toprol -XL. Recommend taking olmesartan  40 mg p.o. every morning. Recommend taking amlodipine  10 mg p.o. every afternoon. Will start spironolactone  25 mg p.o. every morning BMP in one week to check renal function and electrolytes. HCTZ and chlorthalidone were avoided given her sulfa allergy.  Palpitations Currently on both Toprol -XL as well as propranolol. Weaning off of Toprol -XL as discussed above. Continue propranolol as prescribed by PCP. Recommend having her thyroid  function rechecked.  She will follow-up with PCP  Type 2 diabetes mellitus without complication, without long-term current use of insulin  (HCC) Reemphasized importance of glycemic control. Currently on ARB, Invokana, Crestor  Mixed hyperlipidemia Continue Crestor 20 mg p.o. nightly   Orders Placed:  Orders Placed This Encounter  Procedures   Basic metabolic panel with GFR    Standing Status:   Future    Expected Date:   01/23/2024    Expiration Date:   01/19/2025   EKG 12-Lead     Final Medication List:    Meds ordered this encounter  Medications   spironolactone  (ALDACTONE ) 25 MG tablet     Sig: Take 1 tablet (25 mg total) by mouth daily.    Dispense:  90 tablet    Refill:  3    There are no discontinued medications.   Current Outpatient Medications:    amLODipine  (NORVASC ) 10 MG tablet, Take 1 tablet (10 mg total) by mouth daily., Disp: 30 tablet, Rfl: 3   azaTHIOprine  (IMURAN ) 50 MG tablet, TAKE 2 TABLETS(100 MG) BY MOUTH DAILY, Disp: 180 tablet, Rfl: 3   canagliflozin (INVOKANA) 100 MG TABS tablet, Take by mouth daily before breakfast., Disp: , Rfl:    metoprolol  succinate (TOPROL -XL) 50 MG 24 hr tablet, Take 1.5 tablets (75 mg total) by mouth at bedtime. Take with or immediately following a meal., Disp: 90 tablet, Rfl: 3   ofloxacin (OCUFLOX) 0.3 % ophthalmic solution, , Disp: , Rfl:    olmesartan  (BENICAR ) 40 MG tablet, Take 1 tablet by mouth daily., Disp: , Rfl:    pioglitazone (ACTOS) 30 MG tablet, Take 30 mg by mouth daily., Disp: , Rfl:    propranolol (INDERAL) 20 MG tablet, Take 20 mg by mouth every 6 (six) hours as needed., Disp: , Rfl:    rosuvastatin (CRESTOR) 20 MG tablet, Take 20 mg by mouth daily., Disp: , Rfl:    spironolactone  (ALDACTONE ) 25 MG tablet, Take 1 tablet (25 mg total) by mouth daily., Disp: 90 tablet, Rfl: 3   traZODone (DESYREL) 50 MG tablet, Take 25 mg by mouth at bedtime., Disp: , Rfl:    Metoprolol  Tartrate 75 MG TABS, Take 1 tablet by mouth at bedtime., Disp: , Rfl:    ONETOUCH VERIO test strip, , Disp: , Rfl:    sitaGLIPtin (JANUVIA) 100 MG tablet, Take 100 mg by mouth daily. (Patient not taking: Reported on 01/16/2024), Disp: , Rfl:  Consent:   NA  Disposition:   16-month follow-up sooner if needed  Her questions and concerns were addressed to her satisfaction. She voices understanding of the recommendations provided during this encounter.    Signed, Madonna Michele HAS, [facility]  A Division of [facility] [address], [place], [place] [address]

## 2024-01-16 NOTE — Patient Instructions (Addendum)
 Medication Instructions:  Weaning off Toprol -XL. Week 1 and 2: Toprol -XL 50 mg 0.5 tablets every morning Week 3 and 4: Toprol -XL 50 mg 0.5 tablets every other day Week 5: Stop Toprol -XL  START Spironolactone  25 mg, Take one (1) tablet by mouth once daily in the morning. *If you need a refill on your cardiac medications before your next appointment, please call your pharmacy*  Lab Work: BMET in 1 week If you have labs (blood work) drawn today and your tests are completely normal, you will receive your results only by: MyChart Message (if you have MyChart) OR A paper copy in the mail If you have any lab test that is abnormal or we need to change your treatment, we will call you to review the results.  Testing/Procedures: None ordered  Follow-Up: At [facility], you and your health needs are our priority.  As part of our continuing mission to provide you with exceptional heart care, our providers are all part of one team.  This team includes your primary Cardiologist (physician) and Advanced Practice Providers or APPs (Physician Assistants and Nurse Practitioners) who all work together to provide you with the care you need, when you need it.  Your next appointment:   6 months   Provider:   Madonna Large, DO    We recommend signing up for the patient portal called MyChart.  Sign up information is provided on this After Visit Summary.  MyChart is used to connect with patients for Virtual Visits (Telemedicine).  Patients are able to view lab/test results, encounter notes, upcoming appointments, etc.  Non-urgent messages can be sent to your provider as well.   To learn more about what you can do with MyChart, go to [email or web address].

## 2024-01-20 ENCOUNTER — Encounter: Payer: Self-pay | Admitting: Cardiology

## 2024-01-24 ENCOUNTER — Other Ambulatory Visit: Payer: Self-pay

## 2024-01-24 DIAGNOSIS — I10 Essential (primary) hypertension: Secondary | ICD-10-CM | POA: Diagnosis not present

## 2024-01-25 ENCOUNTER — Ambulatory Visit: Payer: Self-pay | Admitting: Cardiology

## 2024-01-25 LAB — BASIC METABOLIC PANEL WITH GFR
BUN/Creatinine Ratio: 18 (ref 12–28)
BUN: 19 mg/dL (ref 8–27)
CO2: 26 mmol/L (ref 20–29)
Calcium: 10.1 mg/dL (ref 8.7–10.3)
Chloride: 99 mmol/L (ref 96–106)
Creatinine, Ser: 1.05 mg/dL — ABNORMAL HIGH (ref 0.57–1.00)
Glucose: 129 mg/dL — ABNORMAL HIGH (ref 70–99)
Potassium: 5 mmol/L (ref 3.5–5.2)
Sodium: 139 mmol/L (ref 134–144)
eGFR: 56 mL/min/1.73 — ABNORMAL LOW (ref >59)

## 2024-01-26 NOTE — Telephone Encounter (Signed)
-----   Message from [facility] sent at 01/25/2024 12:05 PM EST ----- Ms. Cassidy Carlson,  These labs should reflect recent changes -recent spironolactone  initiation Electrolytes are within acceptable limits.   Kidney function is relatively at baseline. Please have her increase intake of water by 2 to 3 glasses/day. Please find out when her blood pressures are now trending Call if questions arise.  Dr. Michele ----- Message ----- From: Interface, [facility] Lab Results In Sent: 01/25/2024   1:36 AM EST To: Madonna Michele, DO

## 2024-10-07 ENCOUNTER — Ambulatory Visit: Admitting: Neurology
